# Patient Record
Sex: Female | Born: 1965
Health system: Southern US, Community
[De-identification: ages and names within clinical notes are randomized; demographics above are authoritative.]

---

## 2015-08-21 ENCOUNTER — Emergency Department (HOSPITAL_COMMUNITY)
Admission: EM | Admit: 2015-08-21 | Discharge: 2015-08-21 | Disposition: A | Discharge status: Home / Self Care | Payer: Medicaid Other | Attending: Emergency Medicine | Admitting: Emergency Medicine

## 2015-08-21 ENCOUNTER — Emergency Department (HOSPITAL_COMMUNITY): Payer: Medicaid Other

## 2015-08-21 ENCOUNTER — Encounter (HOSPITAL_COMMUNITY): Payer: Self-pay | Admitting: *Deleted

## 2015-08-21 DIAGNOSIS — R109 Unspecified abdominal pain: Secondary | ICD-10-CM | POA: Insufficient documentation

## 2015-08-21 DIAGNOSIS — R509 Fever, unspecified: Secondary | ICD-10-CM | POA: Insufficient documentation

## 2015-08-21 DIAGNOSIS — M2550 Pain in unspecified joint: Secondary | ICD-10-CM | POA: Diagnosis present

## 2015-08-21 DIAGNOSIS — R05 Cough: Secondary | ICD-10-CM | POA: Diagnosis not present

## 2015-08-21 DIAGNOSIS — R630 Anorexia: Secondary | ICD-10-CM | POA: Insufficient documentation

## 2015-08-21 DIAGNOSIS — R531 Weakness: Secondary | ICD-10-CM | POA: Diagnosis not present

## 2015-08-21 DIAGNOSIS — R51 Headache: Secondary | ICD-10-CM | POA: Diagnosis not present

## 2015-08-21 DIAGNOSIS — R059 Cough, unspecified: Secondary | ICD-10-CM

## 2015-08-21 LAB — CBC WITH DIFFERENTIAL/PLATELET
BASOS ABS: 0 10*3/uL (ref 0.0–0.1)
Basophils Relative: 1 % (ref 0–1)
EOS PCT: 3 % (ref 0–5)
Eosinophils Absolute: 0.2 10*3/uL (ref 0.0–0.7)
HCT: 42.3 % (ref 36.0–46.0)
Hemoglobin: 13.7 g/dL (ref 12.0–15.0)
LYMPHS ABS: 1.2 10*3/uL (ref 0.7–4.0)
LYMPHS PCT: 17 % (ref 12–46)
MCH: 30 pg (ref 26.0–34.0)
MCHC: 32.4 g/dL (ref 30.0–36.0)
MCV: 92.6 fL (ref 78.0–100.0)
MONO ABS: 0.7 10*3/uL (ref 0.1–1.0)
Monocytes Relative: 10 % (ref 3–12)
Neutro Abs: 5.1 10*3/uL (ref 1.7–7.7)
Neutrophils Relative %: 69 % (ref 43–77)
PLATELETS: 224 10*3/uL (ref 150–400)
RBC: 4.57 MIL/uL (ref 3.87–5.11)
RDW: 13.1 % (ref 11.5–15.5)
WBC: 7.3 10*3/uL (ref 4.0–10.5)

## 2015-08-21 LAB — COMPREHENSIVE METABOLIC PANEL
ALT: 20 U/L (ref 14–54)
ANION GAP: 7 (ref 5–15)
AST: 27 U/L (ref 15–41)
Albumin: 3.5 g/dL (ref 3.5–5.0)
Alkaline Phosphatase: 75 U/L (ref 38–126)
BUN: 10 mg/dL (ref 6–20)
CHLORIDE: 104 mmol/L (ref 101–111)
CO2: 27 mmol/L (ref 22–32)
Calcium: 9.1 mg/dL (ref 8.9–10.3)
Creatinine, Ser: 0.68 mg/dL (ref 0.44–1.00)
Glucose, Bld: 106 mg/dL — ABNORMAL HIGH (ref 65–99)
POTASSIUM: 4 mmol/L (ref 3.5–5.1)
Sodium: 138 mmol/L (ref 135–145)
Total Bilirubin: 0.6 mg/dL (ref 0.3–1.2)
Total Protein: 7.5 g/dL (ref 6.5–8.1)

## 2015-08-21 LAB — URINALYSIS, ROUTINE W REFLEX MICROSCOPIC
Bilirubin Urine: NEGATIVE
Glucose, UA: NEGATIVE mg/dL
Hgb urine dipstick: NEGATIVE
Ketones, ur: NEGATIVE mg/dL
LEUKOCYTES UA: NEGATIVE
Nitrite: NEGATIVE
PROTEIN: NEGATIVE mg/dL
Specific Gravity, Urine: 1.017 (ref 1.005–1.030)
UROBILINOGEN UA: 0.2 mg/dL (ref 0.0–1.0)
pH: 7.5 (ref 5.0–8.0)

## 2015-08-21 LAB — I-STAT CG4 LACTIC ACID, ED: LACTIC ACID, VENOUS: 1.27 mmol/L (ref 0.5–2.0)

## 2015-08-21 MED ORDER — MORPHINE SULFATE (PF) 2 MG/ML IV SOLN
2.0000 mg | Freq: Once | INTRAVENOUS | Status: AC
  Administered 2015-08-21: 2 mg via INTRAVENOUS
  Filled 2015-08-21: qty 1

## 2015-08-21 MED ORDER — METOCLOPRAMIDE HCL 5 MG/ML IJ SOLN
5.0000 mg | Freq: Once | INTRAMUSCULAR | Status: AC
  Administered 2015-08-21: 5 mg via INTRAVENOUS
  Filled 2015-08-21: qty 2

## 2015-08-21 MED ORDER — DIPHENHYDRAMINE HCL 50 MG/ML IJ SOLN
25.0000 mg | Freq: Once | INTRAMUSCULAR | Status: AC
  Administered 2015-08-21: 25 mg via INTRAVENOUS
  Filled 2015-08-21: qty 1

## 2015-08-21 MED ORDER — SODIUM CHLORIDE 0.9 % IV SOLN
INTRAVENOUS | Status: DC
  Administered 2015-08-21: 10:00:00 via INTRAVENOUS

## 2015-08-21 MED ORDER — TRAMADOL HCL 50 MG PO TABS: 50.0000 mg | ORAL_TABLET | Freq: Four times a day (QID) | ORAL | Status: DC | PRN

## 2015-08-21 NOTE — ED Notes (Signed)
Pt now reporting abdominal pain.

## 2015-08-21 NOTE — ED Notes (Addendum)
Pt c/o headache, abdominal pain, joint pain and fevers x 1 week.  Denies emesis or diarrhea, but c/o decreased appetite.  Clinical cytogeneticist used.

## 2015-08-21 NOTE — ED Notes (Signed)
MD at bedside with interpreter phone to give results.

## 2015-08-21 NOTE — Discharge Instructions (Signed)

## 2015-08-21 NOTE — ED Provider Notes (Signed)
CSN: [id number]     Arrival date & time 08/21/15  8295 History   First MD Initiated Contact with Patient 08/21/15 0935     Chief Complaint  Patient presents with  . Headache  . Joint Pain  . Abdominal Pain     (Consider location/radiation/quality/duration/timing/severity/associated sxs/prior Treatment) HPI Comments: Patient here complaining of one week of mild headache without photophobia, abdominal pain joint pain and subjective fevers. No vomiting or diarrhea but decreased appetite noted. Denies any rashes. No change in color to her urine. Denies any neck discomfort. History of malaria 10 months ago and has been discussion for the past 5 months. Denies any rashes. No treatment use prior to arrival nothing makes her symptoms better or worse  Patient is a 49 y.o. female presenting with headaches and abdominal pain. The history is provided by the patient. A language interpreter was used.  Headache Associated symptoms: abdominal pain   Abdominal Pain   History reviewed. No pertinent past medical history. Past Surgical History  Procedure Laterality Date  . Cesarean section     No family history on file. Social History  Substance Use Topics  . Smoking status: Never Smoker   . Smokeless tobacco: None  . Alcohol Use: No   OB History    No data available     Review of Systems  Gastrointestinal: Positive for abdominal pain.  Neurological: Positive for headaches.  All other systems reviewed and are negative.     Allergies  Review of patient's allergies indicates no known allergies.  Home Medications   Prior to Admission medications   Not on File   BP 120/73 mmHg  Pulse 80  Temp(Src) 98.5 F (36.9 C) (Oral)  Resp 17  SpO2 98%  LMP 07/20/2014 Physical Exam  Constitutional: She is oriented to person, place, and time. She appears well-developed and well-nourished.  Non-toxic appearance. No distress.  HENT:  Head: Normocephalic and atraumatic.  Eyes: Conjunctivae, EOM  and lids are normal. Pupils are equal, round, and reactive to light.  Neck: Normal range of motion. Neck supple. No tracheal deviation present. No thyroid mass present.  Cardiovascular: Normal rate, regular rhythm and normal heart sounds.  Exam reveals no gallop.   No murmur heard. Pulmonary/Chest: Effort normal and breath sounds normal. No stridor. No respiratory distress. She has no decreased breath sounds. She has no wheezes. She has no rhonchi. She has no rales.  Abdominal: Soft. Normal appearance and bowel sounds are normal. She exhibits no distension. There is no tenderness. There is no rebound and no CVA tenderness.  Musculoskeletal: Normal range of motion. She exhibits no edema or tenderness.  Neurological: She is alert and oriented to person, place, and time. She has normal strength. No cranial nerve deficit or sensory deficit. GCS eye subscore is 4. GCS verbal subscore is 5. GCS motor subscore is 6.  Skin: Skin is warm and dry. No abrasion and no rash noted.  Psychiatric: She has a normal mood and affect. Her speech is normal and behavior is normal.  Nursing note and vitals reviewed.   ED Course  Procedures (including critical care time) Labs Review Labs Reviewed  URINE CULTURE  CULTURE, BLOOD (ROUTINE X 2)  CULTURE, BLOOD (ROUTINE X 2)  MALARIA SMEAR  CBC WITH DIFFERENTIAL/PLATELET  COMPREHENSIVE METABOLIC PANEL  URINALYSIS, ROUTINE W REFLEX MICROSCOPIC (NOT AT [facility])  I-STAT CG4 LACTIC ACID, ED    Imaging Review No results found. I have personally reviewed and evaluated these images and lab results as  part of my medical decision-making.   EKG Interpretation None      MDM   Final diagnoses:  Cough    Patient's workup here shows no evidence of her symptoms. She relates that she has had abdominal discomfort and mild headache for one month in duration. Was given medications here feels better. No signs of acute neurological condition at this time. We given [facility]    Lorre Nick, MD 08/21/15 [phone]

## 2015-08-21 NOTE — ED Notes (Signed)
Per lab- preliminary result for malaria screen is negative.

## 2015-08-22 LAB — MALARIA SMEAR

## 2015-08-22 LAB — URINE CULTURE: CULTURE: NO GROWTH

## 2015-08-26 LAB — CULTURE, BLOOD (ROUTINE X 2)
Culture: NO GROWTH
Culture: NO GROWTH

## 2015-09-11 ENCOUNTER — Other Ambulatory Visit (HOSPITAL_COMMUNITY)
Admission: RE | Admit: 2015-09-11 | Discharge: 2015-09-11 | Disposition: A | Discharge status: Home / Self Care | Payer: Medicaid Other | Source: Ambulatory Visit | Attending: Obstetrics & Gynecology | Admitting: Obstetrics & Gynecology

## 2015-09-11 ENCOUNTER — Ambulatory Visit (INDEPENDENT_AMBULATORY_CARE_PROVIDER_SITE_OTHER): Payer: Medicaid Other | Admitting: Obstetrics & Gynecology

## 2015-09-11 ENCOUNTER — Encounter: Payer: Self-pay | Admitting: Obstetrics & Gynecology

## 2015-09-11 VITALS — BP 108/67 | HR 79 | Temp 98.0°F | Wt 128.0 lb

## 2015-09-11 DIAGNOSIS — Z01411 Encounter for gynecological examination (general) (routine) with abnormal findings: Secondary | ICD-10-CM | POA: Diagnosis present

## 2015-09-11 DIAGNOSIS — Z01419 Encounter for gynecological examination (general) (routine) without abnormal findings: Secondary | ICD-10-CM

## 2015-09-11 DIAGNOSIS — R87810 Cervical high risk human papillomavirus (HPV) DNA test positive: Secondary | ICD-10-CM | POA: Insufficient documentation

## 2015-09-11 DIAGNOSIS — Z1151 Encounter for screening for human papillomavirus (HPV): Secondary | ICD-10-CM

## 2015-09-11 DIAGNOSIS — Z0000 Encounter for general adult medical examination without abnormal findings: Secondary | ICD-10-CM

## 2015-09-11 DIAGNOSIS — Z124 Encounter for screening for malignant neoplasm of cervix: Secondary | ICD-10-CM | POA: Diagnosis not present

## 2015-09-11 DIAGNOSIS — Z1231 Encounter for screening mammogram for malignant neoplasm of breast: Secondary | ICD-10-CM | POA: Diagnosis not present

## 2015-09-11 NOTE — Progress Notes (Signed)
GYNECOLOGY CLINIC ANNUAL PREVENTATIVE CARE ENCOUNTER NOTE  Subjective:   Olivia Rocha is a 49 y.o. [id number] female here for a routine annual gynecologic exam.  She had [place] come into the country from [place] and appointments were made for her to come here and [facility] to establish care. [place] phone interpreter used Engineer, agricultural) used. Current complaints: no periods for a year.   Denies abnormal vaginal bleeding, discharge, pelvic pain, problems with intercourse or other gynecologic concerns.    Gynecologic History Patient's last menstrual period was 07/20/2014. Contraception: none and probably menopausal Last Pap: cannot recall if she ever had one. Never had a mammogram.  Obstetric History OB History  Gravida Para Term Preterm AB SAB TAB Ectopic Multiple Living  # Outcome Date GA Lbr Len/2nd Weight Sex Delivery Anes PTL Lv  13 SAB           12 Term           11 Term           10 Term           9 Term           8 Term           7 Term           6 Term           5 Term           4 Term           3 Term           2 Term           1 Term               No past medical history on file.  Past Surgical History  Procedure Laterality Date  . Cesarean section      Current Outpatient Prescriptions on File Prior to Visit  Medication Sig Dispense Refill  . traMADol (ULTRAM) 50 MG tablet Take 1 tablet (50 mg total) by mouth every 6 (six) hours as needed. (Patient not taking: Reported on 09/11/2015) 15 tablet 0   No current facility-administered medications on file prior to visit.    No Known Allergies  Social History   Social History  . Marital Status: Married    Spouse Name: N/A  . Number of Children: N/A  . Years of Education: N/A   Occupational History  . Not on file.   Social History Main Topics  . Smoking status: Never Smoker   . Smokeless tobacco: Not on file  . Alcohol Use: No  . Drug Use: No  .  Sexual Activity: Yes    Birth Control/ Protection: None   Other Topics Concern  . Not on file   Social History Narrative    No family history on file.  The following portions of the patient's history were reviewed and updated as appropriate: allergies, current medications, past family history, past medical history, past social history, past surgical history and problem list.  Review of Systems Pertinent items are noted in HPI.   Objective:  BP 108/67 mmHg  Pulse 79  Temp(Src) 98 F (36.7 C)  Wt 128 lb (58.06 kg)  LMP 07/20/2014 CONSTITUTIONAL: Well-developed, well-nourished female in no acute distress.  HENT:  Normocephalic, atraumatic, External right and left ear normal. Oropharynx is clear and moist EYES: Conjunctivae and EOM are  normal. Pupils are equal, round, and reactive to light. No scleral icterus.  NECK: Normal range of motion, supple, no masses.  Normal thyroid.  SKIN: Skin is warm and dry. No rash noted. Not diaphoretic. No erythema. No pallor. NEUROLGIC: Alert and oriented to person, place, and time. Normal reflexes, muscle tone coordination. No cranial nerve deficit noted. PSYCHIATRIC: Normal mood and affect. Normal behavior. Normal judgment and thought content. CARDIOVASCULAR: Normal heart rate noted, regular rhythm RESPIRATORY: Clear to auscultation bilaterally. Effort and breath sounds normal, no problems with respiration noted. BREASTS: Symmetric in size. No masses, skin changes, nipple drainage, or lymphadenopathy. ABDOMEN: Soft, normal bowel sounds, no distention noted.  No tenderness, rebound or guarding.   Badly healed vertical scar noted from patients cesarean section.  PELVIC: Patient has never had a pelvic exam outside pregnancy, was a difficult exam and had to use Pederson speculum despite adequate introitus.  Normal appearing external genitalia with Grade 1-2 anterior and posterior prolapse.  Unable to dermacate cervix from surrounding vagina, atrophy  noted.  Pap smear obtained, endocervical brush was able to delineate endocervical canal.  Unable to palpate uterus or adnexa due to voluntary guarding.    MUSCULOSKELETAL: Normal range of motion. No tenderness.  No cyanosis, clubbing, or edema.  2+ distal pulses.   Assessment:  Annual gynecologic examination with pap smear Amenorrhea x 1 year   Plan:  Will follow up results of pap smear and manage accordingly. Mammogram scheduled Patient was told she was most likely menopausal; was told to call for any concerns Routine preventative health maintenance measures emphasized. Please refer to After Visit Summary for other counseling recommendations.    Jaynie Collins, MD, FACOG Attending Obstetrician & Gynecologist, [facility]

## 2015-09-11 NOTE — Patient Instructions (Signed)
Preventive Care for Adults A healthy lifestyle and preventive care can promote health and wellness. Preventive health guidelines for women include the following key practices.  A routine yearly physical is a good way to check with your health care provider about your health and preventive screening. It is a chance to share any concerns and updates on your health and to receive a thorough exam.  Visit your dentist for a routine exam and preventive care every 6 months. Brush your teeth twice a day and floss once a day. Good oral hygiene prevents tooth decay and gum disease.  The frequency of eye exams is based on your age, health, family medical history, use of contact lenses, and other factors. Follow your health care provider's recommendations for frequency of eye exams.  Eat a healthy diet. Foods like vegetables, fruits, whole grains, low-fat dairy products, and lean protein foods contain the nutrients you need without too many calories. Decrease your intake of foods high in solid fats, added sugars, and salt. Eat the right amount of calories for you.Get information about a proper diet from your health care provider, if necessary.  Regular physical exercise is one of the most important things you can do for your health. Most adults should get at least 150 minutes of moderate-intensity exercise (any activity that increases your heart rate and causes you to sweat) each week. In addition, most adults need muscle-strengthening exercises on 2 or more days a week.  Maintain a healthy weight. The body mass index (BMI) is a screening tool to identify possible weight problems. It provides an estimate of body fat based on height and weight. Your health care provider can find your BMI and can help you achieve or maintain a healthy weight.For adults 20 years and older:  A BMI below 18.5 is considered underweight.  A BMI of 18.5 to 24.9 is normal.  A BMI of 25 to 29.9 is considered overweight.  A BMI of  30 and above is considered obese.  Maintain normal blood lipids and cholesterol levels by exercising and minimizing your intake of saturated fat. Eat a balanced diet with plenty of fruit and vegetables. Blood tests for lipids and cholesterol should begin at age 76 and be repeated every 5 years. If your lipid or cholesterol levels are high, you are over 50, or you are at high risk for heart disease, you may need your cholesterol levels checked more frequently.Ongoing high lipid and cholesterol levels should be treated with medicines if diet and exercise are not working.  If you smoke, find out from your health care provider how to quit. If you do not use tobacco, do not start.  Lung cancer screening is recommended for adults aged 22-80 years who are at high risk for developing lung cancer because of a history of smoking. A yearly low-dose CT scan of the lungs is recommended for people who have at least a 30-pack-year history of smoking and are a current smoker or have quit within the past 15 years. A pack year of smoking is smoking an average of 1 pack of cigarettes a day for 1 year (for example: 1 pack a day for 30 years or 2 packs a day for 15 years). Yearly screening should continue until the smoker has stopped smoking for at least 15 years. Yearly screening should be stopped for people who develop a health problem that would prevent them from having lung cancer treatment.  If you are pregnant, do not drink alcohol. If you are breastfeeding,  be very cautious about drinking alcohol. If you are not pregnant and choose to drink alcohol, do not have more than 1 drink per day. One drink is considered to be 12 ounces (355 mL) of beer, 5 ounces (148 mL) of wine, or 1.5 ounces (44 mL) of liquor.  Avoid use of street drugs. Do not share needles with anyone. Ask for help if you need support or instructions about stopping the use of drugs.  High blood pressure causes heart disease and increases the risk of  stroke. Your blood pressure should be checked at least every 1 to 2 years. Ongoing high blood pressure should be treated with medicines if weight loss and exercise do not work.  If you are 75-52 years old, ask your health care provider if you should take aspirin to prevent strokes.  Diabetes screening involves taking a blood sample to check your fasting blood sugar level. This should be done once every 3 years, after age 15, if you are within normal weight and without risk factors for diabetes. Testing should be considered at a younger age or be carried out more frequently if you are overweight and have at least 1 risk factor for diabetes.  Breast cancer screening is essential preventive care for women. You should practice "breast self-awareness." This means understanding the normal appearance and feel of your breasts and may include breast self-examination. Any changes detected, no matter how small, should be reported to a health care provider. Women in their 58s and 30s should have a clinical breast exam (CBE) by a health care provider as part of a regular health exam every 1 to 3 years. After age 16, women should have a CBE every year. Starting at age 53, women should consider having a mammogram (breast X-ray test) every year. Women who have a family history of breast cancer should talk to their health care provider about genetic screening. Women at a high risk of breast cancer should talk to their health care providers about having an MRI and a mammogram every year.  Breast cancer gene (BRCA)-related cancer risk assessment is recommended for women who have family members with BRCA-related cancers. BRCA-related cancers include breast, ovarian, tubal, and peritoneal cancers. Having family members with these cancers may be associated with an increased risk for harmful changes (mutations) in the breast cancer genes BRCA1 and BRCA2. Results of the assessment will determine the need for genetic counseling and  BRCA1 and BRCA2 testing.  Routine pelvic exams to screen for cancer are no longer recommended for nonpregnant women who are considered low risk for cancer of the pelvic organs (ovaries, uterus, and vagina) and who do not have symptoms. Ask your health care provider if a screening pelvic exam is right for you.  If you have had past treatment for cervical cancer or a condition that could lead to cancer, you need Pap tests and screening for cancer for at least 20 years after your treatment. If Pap tests have been discontinued, your risk factors (such as having a new sexual partner) need to be reassessed to determine if screening should be resumed. Some women have medical problems that increase the chance of getting cervical cancer. In these cases, your health care provider may recommend more frequent screening and Pap tests.  The HPV test is an additional test that may be used for cervical cancer screening. The HPV test looks for the virus that can cause the cell changes on the cervix. The cells collected during the Pap test can be  tested for HPV. The HPV test could be used to screen women aged 30 years and older, and should be used in women of any age who have unclear Pap test results. After the age of 30, women should have HPV testing at the same frequency as a Pap test.  Colorectal cancer can be detected and often prevented. Most routine colorectal cancer screening begins at the age of 50 years and continues through age 75 years. However, your health care provider may recommend screening at an earlier age if you have risk factors for colon cancer. On a yearly basis, your health care provider may provide home test kits to check for hidden blood in the stool. Use of a small camera at the end of a tube, to directly examine the colon (sigmoidoscopy or colonoscopy), can detect the earliest forms of colorectal cancer. Talk to your health care provider about this at age 50, when routine screening begins. Direct  exam of the colon should be repeated every 5-10 years through age 75 years, unless early forms of pre-cancerous polyps or small growths are found.  People who are at an increased risk for hepatitis B should be screened for this virus. You are considered at high risk for hepatitis B if:  You were born in a country where hepatitis B occurs often. Talk with your health care provider about which countries are considered high risk.  Your parents were born in a high-risk country and you have not received a shot to protect against hepatitis B (hepatitis B vaccine).  You have HIV or AIDS.  You use needles to inject street drugs.  You live with, or have sex with, someone who has hepatitis B.  You get hemodialysis treatment.  You take certain medicines for conditions like cancer, organ transplantation, and autoimmune conditions.  Hepatitis C blood testing is recommended for all people born from 1945 through 1965 and any individual with known risks for hepatitis C.  Practice safe sex. Use condoms and avoid high-risk sexual practices to reduce the spread of sexually transmitted infections (STIs). STIs include gonorrhea, chlamydia, syphilis, trichomonas, herpes, HPV, and human immunodeficiency virus (HIV). Herpes, HIV, and HPV are viral illnesses that have no cure. They can result in disability, cancer, and death.  You should be screened for sexually transmitted illnesses (STIs) including gonorrhea and chlamydia if:  You are sexually active and are younger than 24 years.  You are older than 24 years and your health care provider tells you that you are at risk for this type of infection.  Your sexual activity has changed since you were last screened and you are at an increased risk for chlamydia or gonorrhea. Ask your health care provider if you are at risk.  If you are at risk of being infected with HIV, it is recommended that you take a prescription medicine daily to prevent HIV infection. This is  called preexposure prophylaxis (PrEP). You are considered at risk if:  You are a heterosexual woman, are sexually active, and are at increased risk for HIV infection.  You take drugs by injection.  You are sexually active with a partner who has HIV.  Talk with your health care provider about whether you are at high risk of being infected with HIV. If you choose to begin PrEP, you should first be tested for HIV. You should then be tested every 3 months for as long as you are taking PrEP.  Osteoporosis is a disease in which the bones lose minerals and strength   with aging. This can result in serious bone fractures or breaks. The risk of osteoporosis can be identified using a bone density scan. Women ages 65 years and over and women at risk for fractures or osteoporosis should discuss screening with their health care providers. Ask your health care provider whether you should take a calcium supplement or vitamin D to reduce the rate of osteoporosis.  Menopause can be associated with physical symptoms and risks. Hormone replacement therapy is available to decrease symptoms and risks. You should talk to your health care provider about whether hormone replacement therapy is right for you.  Use sunscreen. Apply sunscreen liberally and repeatedly throughout the day. You should seek shade when your shadow is shorter than you. Protect yourself by wearing long sleeves, pants, a wide-brimmed hat, and sunglasses year round, whenever you are outdoors.  Once a month, do a whole body skin exam, using a mirror to look at the skin on your back. Tell your health care provider of new moles, moles that have irregular borders, moles that are larger than a pencil eraser, or moles that have changed in shape or color.  Stay current with required vaccines (immunizations).  Influenza vaccine. All adults should be immunized every year.  Tetanus, diphtheria, and acellular pertussis (Td, Tdap) vaccine. Pregnant women should  receive 1 dose of Tdap vaccine during each pregnancy. The dose should be obtained regardless of the length of time since the last dose. Immunization is preferred during the 27th-36th week of gestation. An adult who has not previously received Tdap or who does not know her vaccine status should receive 1 dose of Tdap. This initial dose should be followed by tetanus and diphtheria toxoids (Td) booster doses every 10 years. Adults with an unknown or incomplete history of completing a 3-dose immunization series with Td-containing vaccines should begin or complete a primary immunization series including a Tdap dose. Adults should receive a Td booster every 10 years.  Varicella vaccine. An adult without evidence of immunity to varicella should receive 2 doses or a second dose if she has previously received 1 dose. Pregnant females who do not have evidence of immunity should receive the first dose after pregnancy. This first dose should be obtained before leaving the health care facility. The second dose should be obtained 4-8 weeks after the first dose.  Human papillomavirus (HPV) vaccine. Females aged 13-26 years who have not received the vaccine previously should obtain the 3-dose series. The vaccine is not recommended for use in pregnant females. However, pregnancy testing is not needed before receiving a dose. If a female is found to be pregnant after receiving a dose, no treatment is needed. In that case, the remaining doses should be delayed until after the pregnancy. Immunization is recommended for any person with an immunocompromised condition through the age of 26 years if she did not get any or all doses earlier. During the 3-dose series, the second dose should be obtained 4-8 weeks after the first dose. The third dose should be obtained 24 weeks after the first dose and 16 weeks after the second dose.  Zoster vaccine. One dose is recommended for adults aged 60 years or older unless certain conditions are  present.  Measles, mumps, and rubella (MMR) vaccine. Adults born before 1957 generally are considered immune to measles and mumps. Adults born in 1957 or later should have 1 or more doses of MMR vaccine unless there is a contraindication to the vaccine or there is laboratory evidence of immunity to   each of the three diseases. A routine second dose of MMR vaccine should be obtained at least 28 days after the first dose for students attending postsecondary schools, health care workers, or international travelers. People who received inactivated measles vaccine or an unknown type of measles vaccine during 1963-1967 should receive 2 doses of MMR vaccine. People who received inactivated mumps vaccine or an unknown type of mumps vaccine before 1979 and are at high risk for mumps infection should consider immunization with 2 doses of MMR vaccine. For females of childbearing age, rubella immunity should be determined. If there is no evidence of immunity, females who are not pregnant should be vaccinated. If there is no evidence of immunity, females who are pregnant should delay immunization until after pregnancy. Unvaccinated health care workers born before 1957 who lack laboratory evidence of measles, mumps, or rubella immunity or laboratory confirmation of disease should consider measles and mumps immunization with 2 doses of MMR vaccine or rubella immunization with 1 dose of MMR vaccine.  Pneumococcal 13-valent conjugate (PCV13) vaccine. When indicated, a person who is uncertain of her immunization history and has no record of immunization should receive the PCV13 vaccine. An adult aged 19 years or older who has certain medical conditions and has not been previously immunized should receive 1 dose of PCV13 vaccine. This PCV13 should be followed with a dose of pneumococcal polysaccharide (PPSV23) vaccine. The PPSV23 vaccine dose should be obtained at least 8 weeks after the dose of PCV13 vaccine. An adult aged 19  years or older who has certain medical conditions and previously received 1 or more doses of PPSV23 vaccine should receive 1 dose of PCV13. The PCV13 vaccine dose should be obtained 1 or more years after the last PPSV23 vaccine dose.  Pneumococcal polysaccharide (PPSV23) vaccine. When PCV13 is also indicated, PCV13 should be obtained first. All adults aged 65 years and older should be immunized. An adult younger than age 65 years who has certain medical conditions should be immunized. Any person who resides in a nursing home or long-term care facility should be immunized. An adult smoker should be immunized. People with an immunocompromised condition and certain other conditions should receive both PCV13 and PPSV23 vaccines. People with human immunodeficiency virus (HIV) infection should be immunized as soon as possible after diagnosis. Immunization during chemotherapy or radiation therapy should be avoided. Routine use of PPSV23 vaccine is not recommended for American Indians, [place] Natives, or people younger than 65 years unless there are medical conditions that require PPSV23 vaccine. When indicated, people who have unknown immunization and have no record of immunization should receive PPSV23 vaccine. One-time revaccination 5 years after the first dose of PPSV23 is recommended for people aged 19-64 years who have chronic kidney failure, nephrotic syndrome, asplenia, or immunocompromised conditions. People who received 1-2 doses of PPSV23 before age 65 years should receive another dose of PPSV23 vaccine at age 65 years or later if at least 5 years have passed since the previous dose. Doses of PPSV23 are not needed for people immunized with PPSV23 at or after age 65 years.  Meningococcal vaccine. Adults with asplenia or persistent complement component deficiencies should receive 2 doses of quadrivalent meningococcal conjugate (MenACWY-D) vaccine. The doses should be obtained at least 2 months apart.  Microbiologists working with certain meningococcal bacteria, military recruits, people at risk during an outbreak, and people who travel to or live in countries with a high rate of meningitis should be immunized. A first-year college student up through age   21 years who is living in a residence hall should receive a dose if she did not receive a dose on or after her 16th birthday. Adults who have certain high-risk conditions should receive one or more doses of vaccine.  Hepatitis A vaccine. Adults who wish to be protected from this disease, have certain high-risk conditions, work with hepatitis A-infected animals, work in hepatitis A research labs, or travel to or work in countries with a high rate of hepatitis A should be immunized. Adults who were previously unvaccinated and who anticipate close contact with an international adoptee during the first 60 days after arrival in the [place] from a country with a high rate of hepatitis A should be immunized.  Hepatitis B vaccine. Adults who wish to be protected from this disease, have certain high-risk conditions, may be exposed to blood or other infectious body fluids, are household contacts or sex partners of hepatitis B positive people, are clients or workers in certain care facilities, or travel to or work in countries with a high rate of hepatitis B should be immunized.  Haemophilus influenzae type b (Hib) vaccine. A previously unvaccinated person with asplenia or sickle cell disease or having a scheduled splenectomy should receive 1 dose of Hib vaccine. Regardless of previous immunization, a recipient of a hematopoietic stem cell transplant should receive a 3-dose series 6-12 months after her successful transplant. Hib vaccine is not recommended for adults with HIV infection. Preventive Services / Frequency Ages 64 to 68 years  Blood pressure check.** / Every 1 to 2 years.  Lipid and cholesterol check.** / Every 5 years beginning at age  22.  Clinical breast exam.** / Every 3 years for women in their 88s and 53s.  BRCA-related cancer risk assessment.** / For women who have family members with a BRCA-related cancer (breast, ovarian, tubal, or peritoneal cancers).  Pap test.** / Every 2 years from ages 90 through 51. Every 3 years starting at age 21 through age 56 or 3 with a history of 3 consecutive normal Pap tests.  HPV screening.** / Every 3 years from ages 24 through ages 1 to 46 with a history of 3 consecutive normal Pap tests.  Hepatitis C blood test.** / For any individual with known risks for hepatitis C.  Skin self-exam. / Monthly.  Influenza vaccine. / Every year.  Tetanus, diphtheria, and acellular pertussis (Tdap, Td) vaccine.** / Consult your health care provider. Pregnant women should receive 1 dose of Tdap vaccine during each pregnancy. 1 dose of Td every 10 years.  Varicella vaccine.** / Consult your health care provider. Pregnant females who do not have evidence of immunity should receive the first dose after pregnancy.  HPV vaccine. / 3 doses over 6 months, if 72 and younger. The vaccine is not recommended for use in pregnant females. However, pregnancy testing is not needed before receiving a dose.  Measles, mumps, rubella (MMR) vaccine.** / You need at least 1 dose of MMR if you were born in 1957 or later. You may also need a 2nd dose. For females of childbearing age, rubella immunity should be determined. If there is no evidence of immunity, females who are not pregnant should be vaccinated. If there is no evidence of immunity, females who are pregnant should delay immunization until after pregnancy.  Pneumococcal 13-valent conjugate (PCV13) vaccine.** / Consult your health care provider.  Pneumococcal polysaccharide (PPSV23) vaccine.** / 1 to 2 doses if you smoke cigarettes or if you have certain conditions.  Meningococcal vaccine.** /  1 dose if you are age 19 to 21 years and a first-year college  student living in a residence hall, or have one of several medical conditions, you need to get vaccinated against meningococcal disease. You may also need additional booster doses.  Hepatitis A vaccine.** / Consult your health care provider.  Hepatitis B vaccine.** / Consult your health care provider.  Haemophilus influenzae type b (Hib) vaccine.** / Consult your health care provider. Ages 40 to 64 years  Blood pressure check.** / Every 1 to 2 years.  Lipid and cholesterol check.** / Every 5 years beginning at age 20 years.  Lung cancer screening. / Every year if you are aged 55-80 years and have a 30-pack-year history of smoking and currently smoke or have quit within the past 15 years. Yearly screening is stopped once you have quit smoking for at least 15 years or develop a health problem that would prevent you from having lung cancer treatment.  Clinical breast exam.** / Every year after age 40 years.  BRCA-related cancer risk assessment.** / For women who have family members with a BRCA-related cancer (breast, ovarian, tubal, or peritoneal cancers).  Mammogram.** / Every year beginning at age 40 years and continuing for as long as you are in good health. Consult with your health care provider.  Pap test.** / Every 3 years starting at age 30 years through age 65 or 70 years with a history of 3 consecutive normal Pap tests.  HPV screening.** / Every 3 years from ages 30 years through ages 65 to 70 years with a history of 3 consecutive normal Pap tests.  Fecal occult blood test (FOBT) of stool. / Every year beginning at age 50 years and continuing until age 75 years. You may not need to do this test if you get a colonoscopy every 10 years.  Flexible sigmoidoscopy or colonoscopy.** / Every 5 years for a flexible sigmoidoscopy or every 10 years for a colonoscopy beginning at age 50 years and continuing until age 75 years.  Hepatitis C blood test.** / For all people born from 1945 through  1965 and any individual with known risks for hepatitis C.  Skin self-exam. / Monthly.  Influenza vaccine. / Every year.  Tetanus, diphtheria, and acellular pertussis (Tdap/Td) vaccine.** / Consult your health care provider. Pregnant women should receive 1 dose of Tdap vaccine during each pregnancy. 1 dose of Td every 10 years.  Varicella vaccine.** / Consult your health care provider. Pregnant females who do not have evidence of immunity should receive the first dose after pregnancy.  Zoster vaccine.** / 1 dose for adults aged 60 years or older.  Measles, mumps, rubella (MMR) vaccine.** / You need at least 1 dose of MMR if you were born in 1957 or later. You may also need a 2nd dose. For females of childbearing age, rubella immunity should be determined. If there is no evidence of immunity, females who are not pregnant should be vaccinated. If there is no evidence of immunity, females who are pregnant should delay immunization until after pregnancy.  Pneumococcal 13-valent conjugate (PCV13) vaccine.** / Consult your health care provider.  Pneumococcal polysaccharide (PPSV23) vaccine.** / 1 to 2 doses if you smoke cigarettes or if you have certain conditions.  Meningococcal vaccine.** / Consult your health care provider.  Hepatitis A vaccine.** / Consult your health care provider.  Hepatitis B vaccine.** / Consult your health care provider.  Haemophilus influenzae type b (Hib) vaccine.** / Consult your health care provider. Ages 65   years and over  Blood pressure check.** / Every 1 to 2 years.  Lipid and cholesterol check.** / Every 5 years beginning at age 22 years.  Lung cancer screening. / Every year if you are aged 73-80 years and have a 30-pack-year history of smoking and currently smoke or have quit within the past 15 years. Yearly screening is stopped once you have quit smoking for at least 15 years or develop a health problem that would prevent you from having lung cancer  treatment.  Clinical breast exam.** / Every year after age 4 years.  BRCA-related cancer risk assessment.** / For women who have family members with a BRCA-related cancer (breast, ovarian, tubal, or peritoneal cancers).  Mammogram.** / Every year beginning at age 40 years and continuing for as long as you are in good health. Consult with your health care provider.  Pap test.** / Every 3 years starting at age 9 years through age 34 or 91 years with 3 consecutive normal Pap tests. Testing can be stopped between 65 and 70 years with 3 consecutive normal Pap tests and no abnormal Pap or HPV tests in the past 10 years.  HPV screening.** / Every 3 years from ages 57 years through ages 64 or 45 years with a history of 3 consecutive normal Pap tests. Testing can be stopped between 65 and 70 years with 3 consecutive normal Pap tests and no abnormal Pap or HPV tests in the past 10 years.  Fecal occult blood test (FOBT) of stool. / Every year beginning at age 15 years and continuing until age 17 years. You may not need to do this test if you get a colonoscopy every 10 years.  Flexible sigmoidoscopy or colonoscopy.** / Every 5 years for a flexible sigmoidoscopy or every 10 years for a colonoscopy beginning at age 86 years and continuing until age 71 years.  Hepatitis C blood test.** / For all people born from [birth year] through 1965 and any individual with known risks for hepatitis C.  Osteoporosis screening.** / A one-time screening for women ages 83 years and over and women at risk for fractures or osteoporosis.  Skin self-exam. / Monthly.  Influenza vaccine. / Every year.  Tetanus, diphtheria, and acellular pertussis (Tdap/Td) vaccine.** / 1 dose of Td every 10 years.  Varicella vaccine.** / Consult your health care provider.  Zoster vaccine.** / 1 dose for adults aged 61 years or older.  Pneumococcal 13-valent conjugate (PCV13) vaccine.** / Consult your health care provider.  Pneumococcal  polysaccharide (PPSV23) vaccine.** / 1 dose for all adults aged 28 years and older.  Meningococcal vaccine.** / Consult your health care provider.  Hepatitis A vaccine.** / Consult your health care provider.  Hepatitis B vaccine.** / Consult your health care provider.  Haemophilus influenzae type b (Hib) vaccine.** / Consult your health care provider. ** Family history and personal history of risk and conditions may change your health care provider's recommendations. Document Released: 02/01/2002 Document Revised: 04/22/2014 Document Reviewed: 05/03/2011 [facility] Patient Information 2015 [place], [place]. This information is not intended to replace advice given to you by your health care provider. Make sure you discuss any questions you have with your health care provider.

## 2015-09-16 ENCOUNTER — Ambulatory Visit (HOSPITAL_COMMUNITY): Payer: Medicaid Other | Attending: Obstetrics & Gynecology

## 2015-09-16 LAB — CYTOLOGY - PAP

## 2015-09-18 ENCOUNTER — Encounter: Payer: Self-pay | Admitting: Obstetrics & Gynecology

## 2015-09-19 ENCOUNTER — Encounter: Payer: Self-pay | Admitting: Family Medicine

## 2015-09-19 ENCOUNTER — Ambulatory Visit: Payer: Medicaid Other | Attending: Family Medicine | Admitting: Family Medicine

## 2015-09-19 VITALS — BP 107/73 | HR 83 | Temp 98.4°F | Resp 16 | Wt 140.0 lb

## 2015-09-19 DIAGNOSIS — M79604 Pain in right leg: Secondary | ICD-10-CM | POA: Insufficient documentation

## 2015-09-19 DIAGNOSIS — R7303 Prediabetes: Secondary | ICD-10-CM | POA: Insufficient documentation

## 2015-09-19 DIAGNOSIS — R739 Hyperglycemia, unspecified: Secondary | ICD-10-CM

## 2015-09-19 DIAGNOSIS — R7309 Other abnormal glucose: Secondary | ICD-10-CM | POA: Insufficient documentation

## 2015-09-19 DIAGNOSIS — Z114 Encounter for screening for human immunodeficiency virus [HIV]: Secondary | ICD-10-CM | POA: Diagnosis not present

## 2015-09-19 DIAGNOSIS — R946 Abnormal results of thyroid function studies: Secondary | ICD-10-CM | POA: Diagnosis not present

## 2015-09-19 DIAGNOSIS — M79605 Pain in left leg: Secondary | ICD-10-CM | POA: Diagnosis not present

## 2015-09-19 DIAGNOSIS — R7989 Other specified abnormal findings of blood chemistry: Secondary | ICD-10-CM

## 2015-09-19 LAB — TSH: TSH: 0.187 u[IU]/mL — AB (ref 0.350–4.500)

## 2015-09-19 LAB — POCT GLYCOSYLATED HEMOGLOBIN (HGB A1C): HEMOGLOBIN A1C: 6.1

## 2015-09-19 LAB — LIPID PANEL
Cholesterol: 179 mg/dL (ref 125–200)
HDL: 62 mg/dL (ref >=46)
LDL CALC: 104 mg/dL (ref <130)
Total CHOL/HDL Ratio: 2.9 Ratio (ref <=5.0)
Triglycerides: 66 mg/dL (ref <150)
VLDL: 13 mg/dL (ref <30)

## 2015-09-19 MED ORDER — IBUPROFEN 600 MG PO TABS: 600.0000 mg | ORAL_TABLET | Freq: Three times a day (TID) | ORAL | Status: DC | PRN

## 2015-09-19 NOTE — Progress Notes (Signed)
[employer] [phone] Establish Care  C/C leg problem  Pain scale #10

## 2015-09-19 NOTE — Progress Notes (Signed)
   Subjective:  Patient ID: Geanie Logan, female    DOB: [birth year]  Age: 49 y.o. MRN: [id number]  CC: Establish Care and Leg Pain  HPI Kristelle Nyiramavugu presents for   1. Leg pain: x 1 year in L leg. No injury. Patient is from [place]. She has had treatment but does not recall what. No weakness in leg. Pain is entire L leg and L hip. She recently also developed R leg pain. She was evaluated in ED at the beginning of the month. Normal labs. Normal CXR.  No past medical history on file.  Past Surgical History  Procedure Laterality Date  . Cesarean section      No family history on file.  Social History  Substance Use Topics  . Smoking status: Never Smoker   . Smokeless tobacco: Not on file  . Alcohol Use: No    ROS Review of Systems  Constitutional: Negative for fever and chills.  Eyes: Negative for visual disturbance.  Respiratory: Negative for shortness of breath.   Cardiovascular: Negative for chest pain.  Gastrointestinal: Negative for blood in stool.  Musculoskeletal: Positive for myalgias and arthralgias. Negative for back pain.  Skin: Negative for rash.  Allergic/Immunologic: Negative for immunocompromised state.  Hematological: Negative for adenopathy. Does not bruise/bleed easily.  Psychiatric/Behavioral: Negative for suicidal ideas and dysphoric mood.    Objective:   Today's Vitals: BP 107/73 mmHg  Pulse 83  Temp(Src) 98.4 F (36.9 C) (Oral)  Resp 16  Wt 140 lb (63.504 kg)  SpO2 96%  LMP 07/20/2014  Physical Exam  Constitutional: She is oriented to person, place, and time. She appears well-developed and well-nourished. No distress.  HENT:  Head: Normocephalic and atraumatic.  Cardiovascular: Normal rate, regular rhythm, normal heart sounds and intact distal pulses.   Pulmonary/Chest: Effort normal and breath sounds normal.  Musculoskeletal: She exhibits no edema or tenderness.       Right hip: Normal.       Left hip: Normal.   Right knee: Normal.       Left knee: Normal.       Right ankle: Normal.  L ankle is slightly inverted Varicose veins on L dorsal foot   Neurological: She is alert and oriented to person, place, and time.  Skin: Skin is warm and dry. No rash noted.  Psychiatric: She has a normal mood and affect.    Assessment & Plan:   Leg pain, bilateral A; L leg pain x one year, cramping and aches. More recent onset of R leg pain. Exam normal except for L foot varicose veins and inversion of L foot. Normal CBC and CMP. P: Lipids Vit D NSAID Tonic water   Prediabetes Patient provided information on low carb eating      Outpatient Encounter Prescriptions as of 09/19/2015  Medication Sig  . ibuprofen (ADVIL,MOTRIN) 600 MG tablet Take 1 tablet (600 mg total) by mouth every 8 (eight) hours as needed.  . [DISCONTINUED] traMADol (ULTRAM) 50 MG tablet Take 1 tablet (50 mg total) by mouth every 6 (six) hours as needed. (Patient not taking: Reported on 09/19/2015)   No facility-administered encounter medications on file as of 09/19/2015.    Follow-up: No Follow-up on file.    Dessa Phi MD

## 2015-09-19 NOTE — Assessment & Plan Note (Signed)
Patient provided information on low carb eating

## 2015-09-19 NOTE — Assessment & Plan Note (Signed)
A; L leg pain x one year, cramping and aches. More recent onset of R leg pain. Exam normal except for L foot varicose veins and inversion of L foot. Normal CBC and CMP. P: Lipids Vit D NSAID Tonic water

## 2015-09-19 NOTE — Patient Instructions (Addendum)
Mrs. Olivia Rocha,   Olivia Rocha was seen today for establish care and leg pain.  Diagnoses and all orders for this visit:  Elevated blood sugar -     POCT glycosylated hemoglobin (Hb A1C)  Prediabetes  Leg pain, bilateral -     Lipid Panel -     Vitamin D, 25-hydroxy -     TSH -     ibuprofen (ADVIL,MOTRIN) 600 MG tablet; Take 1 tablet (600 mg total) by mouth every 8 (eight) hours as needed.  recommend ibuprofen and tonic water. Tonic water if over the counter.  F/u in 3-4 weeks for leg pain  Dr. Armen Pickup

## 2015-09-20 LAB — HIV ANTIBODY (ROUTINE TESTING W REFLEX): HIV: NONREACTIVE

## 2015-09-20 LAB — VITAMIN D 25 HYDROXY (VIT D DEFICIENCY, FRACTURES): VIT D 25 HYDROXY: 28 ng/mL — AB (ref 30–100)

## 2015-09-22 ENCOUNTER — Telehealth: Payer: Self-pay | Admitting: *Deleted

## 2015-09-22 DIAGNOSIS — R7989 Other specified abnormal findings of blood chemistry: Secondary | ICD-10-CM | POA: Insufficient documentation

## 2015-09-22 NOTE — Addendum Note (Signed)
Addended by: Dessa Phi on: 09/22/2015 02:17 PM   Modules accepted: Orders

## 2015-09-22 NOTE — Telephone Encounter (Signed)
See Result note on cytology 09/11/15.   [employer] [id number],Attempted patient, results of cytology given with recommendations by Dr. Macon Large.

## 2015-09-29 ENCOUNTER — Telehealth: Payer: Self-pay

## 2015-09-29 NOTE — Telephone Encounter (Signed)
-----   Message from Josalyn Funches, MD sent at 09/22/2015  2:16 PM EDT ----- Vit D insufficiency, take 2000 IU of D3 daily, OTC TSH is low, f/u free T3 and T4 is needed to evaluate for hyperthyroidism. Patient to return to office for blood draw. Labs ordered.  Screening HIV negative Cholesterol is normal   

## 2015-09-29 NOTE — Telephone Encounter (Signed)
Nurse called patient via [employer], Intepreter [phone], reached voice mail.  Interpreter left message for patient to return call to [facility] with [facility] at [phone]

## 2015-09-30 ENCOUNTER — Telehealth: Payer: Self-pay

## 2015-09-30 NOTE — Telephone Encounter (Signed)
See previous note

## 2015-10-05 DIAGNOSIS — Z7189 Other specified counseling: Secondary | ICD-10-CM

## 2015-10-05 DIAGNOSIS — Z7185 Encounter for immunization safety counseling: Secondary | ICD-10-CM

## 2015-10-05 NOTE — Congregational Nurse Program (Addendum)
10-01-15 Office visit to schedule appointment to obtain lab report for HIV and syphilis test received at [facility], on 04/16/15.                                                                          Blood pressure 111/70. Pulse 81. Appointment : 10-20-15 @ [facility] for laboratory results, MMR (measles, mumps, rubella) and Hepatitis vaccine.  [place] [phone]) [address] [place], [place] Transportation provided (2 bus tickets).    BP 111/70 mmHg  Pulse 81  LMP 07/20/2014

## 2015-10-14 ENCOUNTER — Telehealth: Payer: Self-pay

## 2015-10-14 ENCOUNTER — Ambulatory Visit: Payer: Medicaid Other | Admitting: Family Medicine

## 2015-10-14 NOTE — Telephone Encounter (Signed)
Nurse called patient via [employer], Interpreter [phone]. Reached voicemail, interpreter left message for patient to return call to [facility] at [phone]. Nurse will send letter.

## 2015-10-14 NOTE — Telephone Encounter (Signed)
-----   Message from Dessa PhiJosalyn Funches, MD sent at 09/22/2015  2:16 PM EDT ----- Vit D insufficiency, take 2000 IU of D3 daily, OTC TSH is low, f/u free T3 and T4 is needed to evaluate for hyperthyroidism. Patient to return to office for blood draw. Labs ordered.  Screening HIV negative Cholesterol is normal

## 2015-11-06 ENCOUNTER — Ambulatory Visit: Payer: Medicaid Other | Attending: Family Medicine | Admitting: Family Medicine

## 2015-11-06 ENCOUNTER — Encounter: Payer: Self-pay | Admitting: Family Medicine

## 2015-11-06 VITALS — BP 107/72 | HR 71 | Temp 98.1°F | Resp 16 | Ht <= 58 in | Wt 115.0 lb

## 2015-11-06 DIAGNOSIS — I83892 Varicose veins of left lower extremities with other complications: Secondary | ICD-10-CM | POA: Diagnosis present

## 2015-11-06 DIAGNOSIS — B353 Tinea pedis: Secondary | ICD-10-CM | POA: Insufficient documentation

## 2015-11-06 DIAGNOSIS — M79605 Pain in left leg: Secondary | ICD-10-CM

## 2015-11-06 DIAGNOSIS — M79604 Pain in right leg: Secondary | ICD-10-CM | POA: Insufficient documentation

## 2015-11-06 DIAGNOSIS — R946 Abnormal results of thyroid function studies: Secondary | ICD-10-CM

## 2015-11-06 DIAGNOSIS — R7989 Other specified abnormal findings of blood chemistry: Secondary | ICD-10-CM

## 2015-11-06 LAB — TSH: TSH: 0.273 u[IU]/mL — ABNORMAL LOW (ref 0.350–4.500)

## 2015-11-06 LAB — T3, FREE: T3 FREE: 3.3 pg/mL (ref 2.3–4.2)

## 2015-11-06 LAB — T4, FREE: FREE T4: 1.14 ng/dL (ref 0.80–1.80)

## 2015-11-06 MED ORDER — MEDICAL COMPRESSION STOCKINGS MISC: Status: DC

## 2015-11-06 MED ORDER — ACETAMINOPHEN-CODEINE #3 300-30 MG PO TABS
1.0000 | ORAL_TABLET | Freq: Three times a day (TID) | ORAL | Status: DC | PRN
Start: 2015-11-06 — End: 2018-05-16

## 2015-11-06 MED ORDER — KETOCONAZOLE 2 % EX CREA: 1.0000 "application " | TOPICAL_CREAM | Freq: Every day | CUTANEOUS | Status: DC

## 2015-11-06 NOTE — Assessment & Plan Note (Signed)
A: tinea pedis on L foot P: nizoral cream ordered

## 2015-11-06 NOTE — Assessment & Plan Note (Signed)
A: low TSH P: Repeat TSH T3 and T4

## 2015-11-06 NOTE — Progress Notes (Signed)
Subjective:  Patient ID: Olivia Rocha, female    DOB: 23-Oct-1966  Age: 49 y.o. MRN: [id number]  CC: Leg Pain  HPI Chynna Nyiramavugu presents for   1. Leg pain: x 1 year in L leg. No injury. Patient is from [place]. She has had treatment but does not recall what. No weakness in leg. Pain is entire L leg and L hip. She recently also developed R leg pain. Labs done at last OV normal except for low TSH. Pain is worsening. Ibuprofen has no helped.   2. Low TSH: TSH low at last OV. No vision changes, palpitations or tremors. She denies weight loss.   History reviewed. No pertinent past medical history.  Past Surgical History  Procedure Laterality Date  . Cesarean section      History reviewed. No pertinent family history.  Social History  Substance Use Topics  . Smoking status: Never Smoker   . Smokeless tobacco: Not on file  . Alcohol Use: No    ROS Review of Systems  Constitutional: Negative for fever and chills.  Eyes: Negative for visual disturbance.  Respiratory: Positive for shortness of breath.   Cardiovascular: Positive for leg swelling. Negative for chest pain and palpitations.  Gastrointestinal: Negative for blood in stool.  Musculoskeletal: Positive for myalgias and arthralgias. Negative for back pain.  Skin: Positive for rash.  Allergic/Immunologic: Negative for immunocompromised state.  Hematological: Negative for adenopathy. Does not bruise/bleed easily.  Psychiatric/Behavioral: Negative for suicidal ideas and dysphoric mood.    Objective:   Today's Vitals: BP 107/72 mmHg  Pulse 71  Temp(Src) 98.1 F (36.7 C) (Oral)  Resp 16  Wt 115 lb (52.164 kg)  SpO2 95%  LMP 07/20/2014  Wt Readings from Last 3 Encounters:  11/06/15 115 lb (52.164 kg)  09/19/15 140 lb (63.504 kg)  09/11/15 128 lb (58.06 kg)    Physical Exam  Constitutional: She is oriented to person, place, and time. She appears well-developed and well-nourished. No distress.    HENT:  Head: Normocephalic and atraumatic.  Cardiovascular: Normal rate, regular rhythm, normal heart sounds and intact distal pulses.   Pulmonary/Chest: Effort normal and breath sounds normal.  Musculoskeletal: She exhibits edema. She exhibits no tenderness.       Right hip: Normal.       Left hip: Normal.       Right knee: Normal.       Left knee: Normal.       Right ankle: Normal.  L ankle is slightly inverted Varicose veins on L dorsal foot   Neurological: She is alert and oriented to person, place, and time.  Skin: Skin is warm and dry. Rash noted.  Psychiatric: She has a normal mood and affect.   Assessment & Plan:   Mayre was seen today for leg pain.  Diagnoses and all orders for this visit:  Varicose veins of left leg with edema -     Cancel: Ambulatory referral to Interventional Radiology -     Ambulatory referral to Vascular Surgery -     Elastic Bandages & Supports (MEDICAL COMPRESSION STOCKINGS) MISC; 30 mmHg pressuer  Leg pain, bilateral -     Cancel: Ambulatory referral to Interventional Radiology -     Ambulatory referral to Vascular Surgery -     acetaminophen-codeine (TYLENOL #3) 300-30 MG tablet; Take 1 tablet by mouth every 8 (eight) hours as needed for moderate pain. -     Elastic Bandages & Supports (MEDICAL COMPRESSION STOCKINGS) MISC; 30 mmHg pressuer  Low TSH level -     TSH -     T3, Free -     T4, free  Tinea pedis of left foot -     ketoconazole (NIZORAL) 2 % cream; Apply 1 application topically daily. To L foot   Outpatient Encounter Prescriptions as of 11/06/2015  Medication Sig  . ibuprofen (ADVIL,MOTRIN) 600 MG tablet Take 1 tablet (600 mg total) by mouth every 8 (eight) hours as needed. (Patient not taking: Reported on 11/06/2015)   No facility-administered encounter medications on file as of 11/06/2015.    Follow-up: No Follow-up on file.    Dessa PhiJosalyn Shirah Roseman  MD

## 2015-11-06 NOTE — Assessment & Plan Note (Addendum)
A: varicose veins with pain in L leg P: Referral to vascular  Compression stockings  Tylenol #3 for pain control

## 2015-11-06 NOTE — Progress Notes (Signed)
[employer] [place] # [phone] F/U leg problem Pain scale# 10 No tobacco user  No suicide thought in the past two week

## 2015-11-06 NOTE — Patient Instructions (Addendum)
Diagnoses and all orders for this visit:  Varicose veins of left leg with edema -     Cancel: Ambulatory referral to Interventional Radiology -     Ambulatory referral to Vascular Surgery -     Elastic Bandages & Supports (MEDICAL COMPRESSION STOCKINGS) MISC; 30 mmHg pressuer  Leg pain, bilateral -     Cancel: Ambulatory referral to Interventional Radiology -     Ambulatory referral to Vascular Surgery -     acetaminophen-codeine (TYLENOL #3) 300-30 MG tablet; Take 1 tablet by mouth every 8 (eight) hours as needed for moderate pain. -     Elastic Bandages & Supports (MEDICAL COMPRESSION STOCKINGS) MISC; 30 mmHg pressuer  Low TSH level -     TSH  Tinea pedis of left foot -     ketoconazole (NIZORAL) 2 % cream; Apply 1 application topically daily. To L foot   TSH and free T3 and T4   F/u in 2 months for leg pain   Dr. Armen Pickup   Varicose Veins Varicose veins are veins that have become enlarged and twisted. They are usually seen in the legs but can occur in other parts of the body as well. CAUSES This condition is the result of valves in the veins not working properly. Valves in the veins help to return blood from the leg to the heart. If these valves are damaged, blood flows backward and backs up into the veins in the leg near the skin. This causes the veins to become larger. RISK FACTORS People who are on their feet a lot, who are pregnant, or who are overweight are more likely to develop varicose veins. SIGNS AND SYMPTOMS  Bulging, twisted-appearing, bluish veins, most commonly found on the legs.  Leg pain or a feeling of heaviness. These symptoms may be worse at the end of the day.  Leg swelling.  Changes in skin color. DIAGNOSIS A health care provider can usually diagnose varicose veins by examining your legs. Your health care provider may also recommend an ultrasound of your leg veins. TREATMENT Most varicose veins can be treated at home.However, other treatments are  available for people who have persistent symptoms or want to improve the cosmetic appearance of the varicose veins. These treatment options include:  Sclerotherapy. A solution is injected into the vein to close it off.  Laser treatment. A laser is used to heat the vein to close it off.  Radiofrequency vein ablation. An electrical current produced by radio waves is used to close off the vein.  Phlebectomy. The vein is surgically removed through small incisions made over the varicose vein.  Vein ligation and stripping. The vein is surgically removed through incisions made over the varicose vein after the vein has been tied (ligated). HOME CARE INSTRUCTIONS  Do not stand or sit in one position for long periods of time. Do not sit with your legs crossed. Rest with your legs raised during the day.  Wear compression stockings as directed by your health care provider. These stockings help to prevent blood clots and reduce swelling in your legs.  Do not wear other tight, encircling garments around your legs, pelvis, or waist.  Walk as much as possible to increase blood flow.  Raise the foot of your bed at night with 2-inch blocks.  If you get a cut in the skin over the vein and the vein bleeds, lie down with your leg raised and press on it with a clean cloth until the bleeding stops. Then place  a bandage (dressing) on the cut. See your health care provider if it continues to bleed. SEEK MEDICAL CARE IF:  The skin around your ankle starts to break down.  You have pain, redness, tenderness, or hard swelling in your leg over a vein.  You are uncomfortable because of leg pain.   This information is not intended to replace advice given to you by your health care provider. Make sure you discuss any questions you have with your health care provider.   Document Released: 09/15/2005 Document Revised: 12/27/2014 Document Reviewed: 04/23/2014 Elsevier Interactive Patient Education [employer]2016 Elsevier  Inc.

## 2015-11-07 ENCOUNTER — Other Ambulatory Visit: Payer: Self-pay | Admitting: Pharmacist

## 2015-11-07 MED ORDER — KETOCONAZOLE 2 % EX CREA: TOPICAL_CREAM | CUTANEOUS | Status: DC

## 2015-11-12 ENCOUNTER — Telehealth: Payer: Self-pay | Admitting: *Deleted

## 2015-11-12 NOTE — Telephone Encounter (Signed)
-----   Message from Dessa PhiJosalyn Funches, MD sent at 11/07/2015  9:39 AM EST ----- Subclinical hyperthyroidism with low TSH Normal free T3 and T4

## 2015-11-12 NOTE — Telephone Encounter (Signed)
LVM to return call.

## 2015-11-26 NOTE — Telephone Encounter (Signed)
-----   Message from Earnestine LeysKimberly L Bennett-Curse, [place] sent at 09/24/2015  5:11 PM EDT -----   ----- Message -----    From: Dessa PhiJosalyn Funches, MD    Sent: 09/22/2015   2:16 PM      To: Tandy GawHeather A Woods, RN, #  Vit D insufficiency, take 2000 IU of D3 daily, OTC TSH is low, f/u free T3 and T4 is needed to evaluate for hyperthyroidism. Patient to return to office for blood draw. Labs ordered.  Screening HIV negative Cholesterol is normal

## 2015-12-28 ENCOUNTER — Emergency Department (HOSPITAL_COMMUNITY)
Admission: EM | Admit: 2015-12-28 | Discharge: 2015-12-29 | Disposition: A | Discharge status: Home / Self Care | Payer: Medicaid Other | Attending: Emergency Medicine | Admitting: Emergency Medicine

## 2015-12-28 ENCOUNTER — Encounter (HOSPITAL_COMMUNITY): Payer: Self-pay

## 2015-12-28 ENCOUNTER — Emergency Department (HOSPITAL_COMMUNITY): Payer: Medicaid Other

## 2015-12-28 DIAGNOSIS — W19XXXA Unspecified fall, initial encounter: Secondary | ICD-10-CM

## 2015-12-28 DIAGNOSIS — Z79899 Other long term (current) drug therapy: Secondary | ICD-10-CM | POA: Insufficient documentation

## 2015-12-28 DIAGNOSIS — Y998 Other external cause status: Secondary | ICD-10-CM | POA: Insufficient documentation

## 2015-12-28 DIAGNOSIS — Y9389 Activity, other specified: Secondary | ICD-10-CM | POA: Insufficient documentation

## 2015-12-28 DIAGNOSIS — S93601A Unspecified sprain of right foot, initial encounter: Secondary | ICD-10-CM | POA: Insufficient documentation

## 2015-12-28 DIAGNOSIS — Y9289 Other specified places as the place of occurrence of the external cause: Secondary | ICD-10-CM | POA: Diagnosis not present

## 2015-12-28 DIAGNOSIS — M545 Low back pain: Secondary | ICD-10-CM

## 2015-12-28 DIAGNOSIS — W000XXA Fall on same level due to ice and snow, initial encounter: Secondary | ICD-10-CM | POA: Diagnosis not present

## 2015-12-28 DIAGNOSIS — S99921A Unspecified injury of right foot, initial encounter: Secondary | ICD-10-CM | POA: Diagnosis present

## 2015-12-28 DIAGNOSIS — S3992XA Unspecified injury of lower back, initial encounter: Secondary | ICD-10-CM | POA: Diagnosis not present

## 2015-12-28 MED ORDER — NAPROXEN 250 MG PO TABS
500.0000 mg | ORAL_TABLET | Freq: Once | ORAL | Status: AC
  Administered 2015-12-29: 500 mg via ORAL
  Filled 2015-12-28: qty 2

## 2015-12-28 NOTE — ED Provider Notes (Signed)
CSN: [id number]     Arrival date & time 12/28/15  2212 History   First MD Initiated Contact with Patient 12/28/15 2248     Chief Complaint  Patient presents with  . Leg Pain     (Consider location/radiation/quality/duration/timing/severity/associated sxs/prior Treatment) HPI Comments: 50 year old female with no significant past medical history presents to the emergency department for evaluation of pain which began secondary to a fall at 10 AM today. Patient fell on her right side on her way to church after slipping on ice. No reported head injury or trauma. Patient complaining of pain in her right foot which radiates up her right leg to her right low back. She also reports having pain in her right lower back. She has been resistant to putting weight on her right foot secondary to pain. No medications taken prior to arrival. No history of fracture.   Patient is a 50 y.o. female presenting with leg pain. The history is provided by the patient and a relative. A language interpreter was used (family at bedside).  Leg Pain Associated symptoms: back pain     History reviewed. No pertinent past medical history. Past Surgical History  Procedure Laterality Date  . Cesarean section     History reviewed. No pertinent family history. Social History  Substance Use Topics  . Smoking status: Never Smoker   . Smokeless tobacco: None  . Alcohol Use: No   OB History    Gravida Para Term Preterm AB TAB SAB Ectopic Multiple Living   13 12 12  1  1   4       Review of Systems  Musculoskeletal: Positive for back pain, joint swelling (mild; R foot) and arthralgias.  Neurological: Negative for syncope.  All other systems reviewed and are negative.   Allergies  Review of patient's allergies indicates no known allergies.  Home Medications   Prior to Admission medications   Medication Sig Start Date End Date Taking? Authorizing Provider  acetaminophen-codeine (TYLENOL #3) 300-30 MG tablet Take 1  tablet by mouth every 8 (eight) hours as needed for moderate pain. 11/06/15   Dessa Phi, MD  Elastic Bandages & Supports (MEDICAL COMPRESSION STOCKINGS) MISC 30 mmHg pressuer 11/06/15   Dessa Phi, MD  ibuprofen (ADVIL,MOTRIN) 600 MG tablet Take 1 tablet (600 mg total) by mouth every 6 (six) hours as needed. 12/29/15   Antony Madura, PA-C  ketoconazole (NIZORAL) 2 % cream APPLY 1 APPLICATION TOPICALLY DAILY TO LEFT FOOT 11/07/15   Josalyn Funches, MD   BP 106/75 mmHg  Pulse 78  Temp(Src) 98.4 F (36.9 C) (Oral)  Resp 20  SpO2 99%  LMP 07/20/2014   Physical Exam  Constitutional: She is oriented to person, place, and time. She appears well-developed and well-nourished. No distress.  Nontoxic/nonseptic appearing  HENT:  Head: Normocephalic and atraumatic.  Eyes: Conjunctivae and EOM are normal. No scleral icterus.  Neck: Normal range of motion.  Cardiovascular: Normal rate, regular rhythm and intact distal pulses.   DP and PT pulses 2+ in the right lower extremity.  Pulmonary/Chest: Effort normal. No respiratory distress.  Respirations even and unlabored  Musculoskeletal: Normal range of motion.       Right knee: She exhibits normal range of motion and no swelling.       Right ankle: Normal.       Cervical back: Normal.       Thoracic back: Normal.       Lumbar back: She exhibits tenderness and pain. She exhibits normal range  of motion, no swelling and no spasm.       Back:       Right foot: There is tenderness, bony tenderness and swelling. There is normal range of motion, normal capillary refill, no deformity and no laceration.       Feet:  No shortening or malrotation of the RLE. Mild swelling to the dorsum of the R foot. No bony deformity or crepitus.  Neurological: She is alert and oriented to person, place, and time. She exhibits normal muscle tone. Coordination normal.  Skin: Skin is warm and dry. No rash noted. She is not diaphoretic. No erythema. No pallor.    Psychiatric: She has a normal mood and affect. Her behavior is normal.  Nursing note and vitals reviewed.   ED Course  Procedures (including critical care time) Labs Review Labs Reviewed - No data to display  Imaging Review Dg Lumbar Spine Complete  12/29/2015  CLINICAL DATA:  Lumbosacral back pain after fall.  Foot pain. EXAM: LUMBAR SPINE - COMPLETE 4+ VIEW COMPARISON:  None. FINDINGS: The bones are under mineralized. There are 6 non-rib-bearing lumbar vertebra, the lower most non-rib-bearing lumbar vertebra will be designated L6. Vertebral body heights are maintained. No evidence of fracture. Alignment is maintained. No significant disc space narrowing. Sacroiliac joints are congruent. IMPRESSION: Bony under mineralization.  No evidence of acute fracture. Electronically Signed   By: Rubye OaksMelanie  Ehinger M.D.   On: 12/29/2015 00:12   Dg Foot Complete Right  12/29/2015  CLINICAL DATA:  50 year old female with fall and trauma to the right foot. EXAM: RIGHT FOOT COMPLETE - 3+ VIEW COMPARISON:  None. FINDINGS: No acute fracture or dislocation. There is diffuse soft tissue swelling of the foot. No radiopaque foreign object. IMPRESSION: No acute fracture. Electronically Signed   By: Elgie CollardArash  Radparvar M.D.   On: 12/29/2015 00:11     I have personally reviewed and evaluated these images and lab results as part of my medical decision-making.   EKG Interpretation None      MDM   Final diagnoses:  Fall, initial encounter  Sprain of foot, right, initial encounter  Right low back pain, with sciatica presence unspecified    50 year old female presents to the emergency department for evaluation of right low back pain and right foot pain after a slip and fall on ice at 10 AM today. No head trauma or loss of consciousness. Patient is neurovascularly intact. X-ray shows no evidence of acute fracture, dislocation, or bony deformity. Symptoms consistent with contusion of the low back as well as sprain of  right foot. Ace wrap applied to foot and supportive care instructions provided. Patient told to follow-up with her primary care doctor if her pain persists. Return precautions given at discharge. Patient discharged in good condition; VSS.   Filed Vitals:   12/28/15 2300 12/28/15 2315 12/29/15 0017 12/29/15 0018  BP: 101/71 108/83 106/75   Pulse: 84 74  78  Temp:      TempSrc:      Resp:      SpO2: 99% 99%  99%       Antony MaduraKelly Sissy Goetzke, PA-C 12/29/15 0025  Gwyneth SproutWhitney Plunkett, MD 01/01/16 1239

## 2015-12-28 NOTE — ED Notes (Signed)
Pt speaks  [place]ese

## 2015-12-28 NOTE — ED Notes (Signed)
To triage via EMS.  Pt does not speak english and EMS does not know what language pt speaks.  Family is on way.  Per EMS pt fell outside 10 hours ago.  C/o right lateral knee pain and buttock pain.  Can bend right knee, + pedal pulse.

## 2015-12-29 MED ORDER — IBUPROFEN 600 MG PO TABS: 600.0000 mg | ORAL_TABLET | Freq: Four times a day (QID) | ORAL | Status: DC | PRN

## 2015-12-29 NOTE — Discharge Instructions (Signed)
Yako eksirei leo ni hasi kwa mifupa yoyote kuvunjwa. Tunapendekeza kwamba kuchukua Tylenol au ibuprofen kwa maumivu. Unaweza wrap mguu wako na Ace wrap kwa msaada kama taka. Ice [employer] ya maumivu mara 3-4 kwa siku kwa muda wa dakika 15-20 kila wakati. Kufuatilia na daktari wake huduma za msingi kama maumivu yataendelea. Kurudi kwa idara ya dharura kama inahitajika kama dalili mbaya zaidi.  Your x-rays today are negative for any broken bones. We recommend that you take Tylenol or ibuprofen for pain. You may wrap your foot with an Ace wrap for support if desired. Ice areas of pain 3-4 times per day for 15-20 minutes each time. Follow-up with her primary care doctor if pain persists. Return to the emergency department as needed if symptoms worsen.  RICE for Routine Care of Injuries Theroutine careofmanyinjuriesincludes rest, ice, compression, and elevation (RICE therapy). RICE therapy is often recommended for injuries to soft tissues, such as a muscle strain, ligament injuries, bruises, and overuse injuries. It can also be used for some bony injuries. Using RICE therapy can help to relieve pain, lessen swelling, and enable your body to heal. Rest Rest is required to allow your body to heal. This usually involves reducing your normal activities and avoiding use of the injured part of your body. Generally, you can return to your normal activities when you are comfortable and have been given permission by your health care provider. Ice Icing your injury helps to keep the swelling down, and it lessens pain. Do not apply ice directly to your skin.  Put ice in a plastic bag.  Place a towel between your skin and the bag.  Leave the ice on for 20 minutes, 2-3 times a day. Do this for as long as you are directed by your health care provider. Compression Compression means putting pressure on the injured area. Compression helps to keep swelling down, gives support, and helps with discomfort. Compression may  be done with an elastic bandage. If an elastic bandage has been applied, follow these general tips:  Remove and reapply the bandage every 3-4 hours or as directed by your health care provider.  Make sure the bandage is not wrapped too tightly, because this can cut off circulation. If part of your body beyond the bandage becomes blue, numb, cold, swollen, or more painful, your bandage is most likely too tight. If this occurs, remove your bandage and reapply it more loosely.  See your health care provider if the bandage seems to be making your problems worse rather than better. Elevation Elevation means keeping the injured area raised. This helps to lessen swelling and decrease pain. If possible, your injured area should be elevated at or above the level of your heart or the center of your chest. WHEN SHOULD I SEEK MEDICAL CARE? You should seek medical care if:  Your pain and swelling continue.  Your symptoms are getting worse rather than improving. These symptoms may indicate that further evaluation or further X-rays are needed. Sometimes, X-rays may not show a small broken bone (fracture) until a number of days later. Make a follow-up appointment with your health care provider. WHEN SHOULD I SEEK IMMEDIATE MEDICAL CARE? You should seek immediate medical care if:  You have sudden severe pain at or below the area of your injury.  You have redness or increased swelling around your injury.  You have tingling or numbness at or below the area of your injury that does not improve after you remove the elastic bandage.   This information  is not intended to replace advice given to you by your health care provider. Make sure you discuss any questions you have with your health care provider.   Document Released: 03/20/2001 Document Revised: 08/27/2015 Document Reviewed: 11/13/2014 Elsevier Interactive Patient Education [employer]2016 Elsevier Inc.

## 2015-12-29 NOTE — ED Notes (Signed)
Pt stable, ambulatory, states understanding of discharge instructions 

## 2016-04-14 DIAGNOSIS — J3489 Other specified disorders of nose and nasal sinuses: Principal | ICD-10-CM

## 2016-04-14 NOTE — Congregational Nurse Program (Signed)
Congregational Nurse Program Note  Date of Encounter: 04/14/2016  Past Medical History: No past medical history on file.  Encounter Details:     CNP Questionnaire - 04/14/16 1427    Patient Demographics   Is this a new or existing patient? Existing   Patient is considered a/an Refugee   Race African   Patient Assistance   Location of Patient Assistance Not Applicable   Patient's financial/insurance status Low Income;Medicaid   Uninsured Patient No   Patient referred to apply for the following financial assistance Not Applicable   Food insecurities addressed Not Applicable   Transportation assistance No   Assistance securing medications No   Educational health offerings Medications   Encounter Details   Primary purpose of visit Acute Illness/Condition Visit   Was an Emergency Department visit averted? Not Applicable   Does patient have a medical provider? Yes   Patient referred to Not Applicable   Was a mental health screening completed? (GAINS tool) No   Does patient have dental issues? No   Does patient have vision issues? No   Since previous encounter, have you referred patient for abnormal blood pressure that resulted in a new diagnosis or medication change? No   Since previous encounter, have you referred patient for abnormal blood glucose that resulted in a new diagnosis or medication change? No     Office visit at [employer] [place] [facility] to request evaluation of nasal congestion , headache and itching throat and eyes several days and OTC med suggestion.  Kinyarwanda language interpreter used. Currently no meds taken. Throat pink without lesions. Chest clear. Headache of frontal area. Denies dizziness or vision blurring. Clear nasal drainage with redness of nasal mucosa. Normal vitals. Plan: Referred to [facility] pharmacist for advice on OTC antihistamines for allergy symptoms. Warm salt water gargles and tea. Return to office if  worse or call PCP for appointment. Ferol LuzMarietta Marijo Quizon, RN/Congregational Nurse.

## 2016-09-03 IMAGING — DX DG LUMBAR SPINE COMPLETE 4+V
5 series · 5 of 5 positions shown · non-contrast
Comparison: None.

CLINICAL DATA: Lumbosacral back pain after fall.  Foot pain.

EXAM:
LUMBAR SPINE - COMPLETE 4+ VIEW

[l-spine ap]
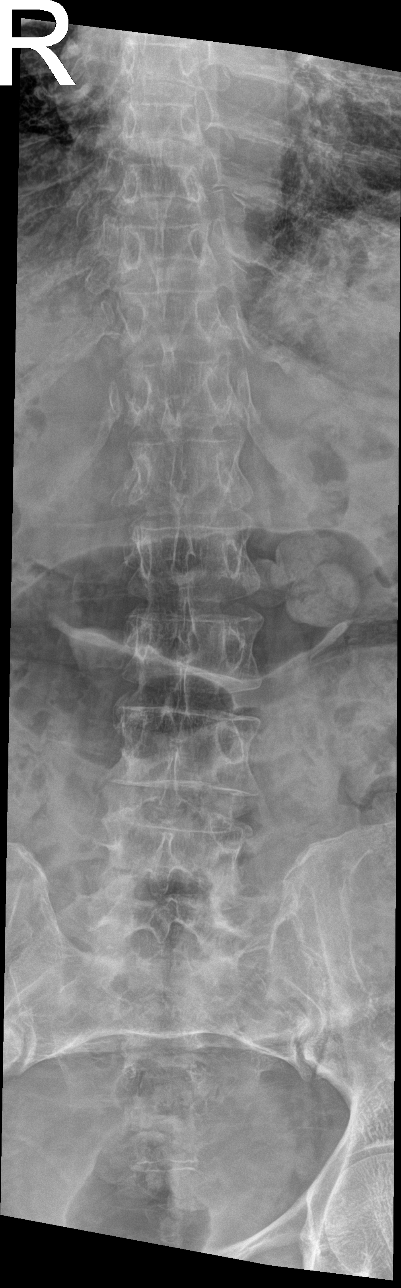

[l-spine obl (1 of 2)]
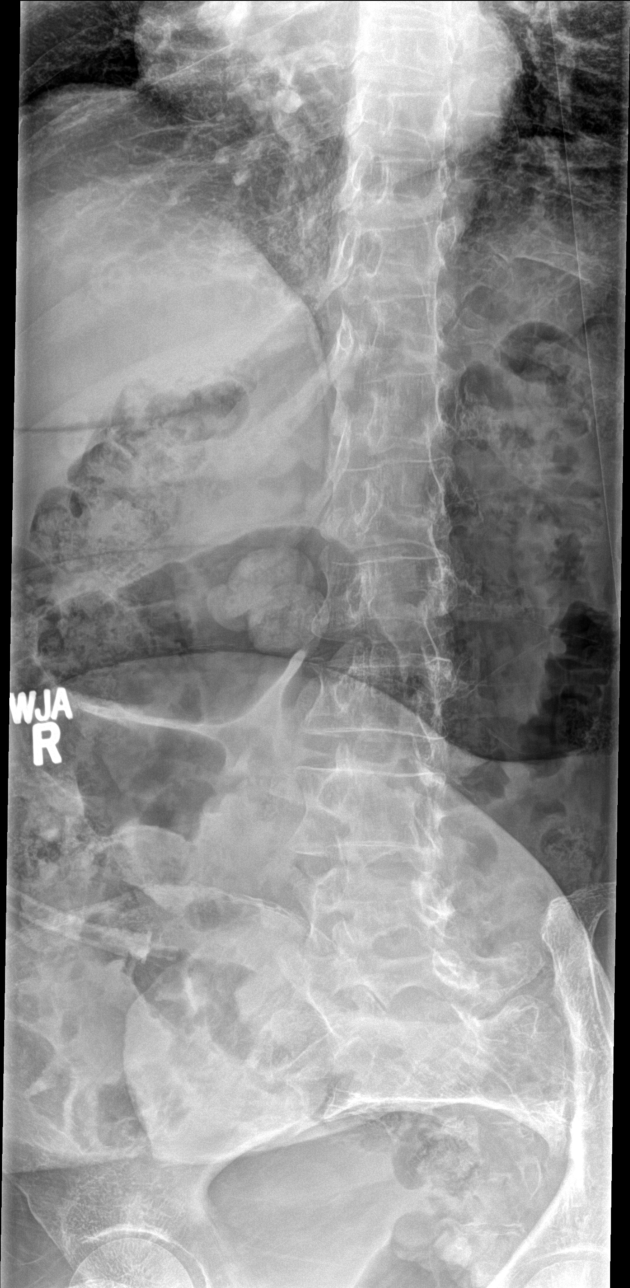

[l-spine obl (2 of 2)]
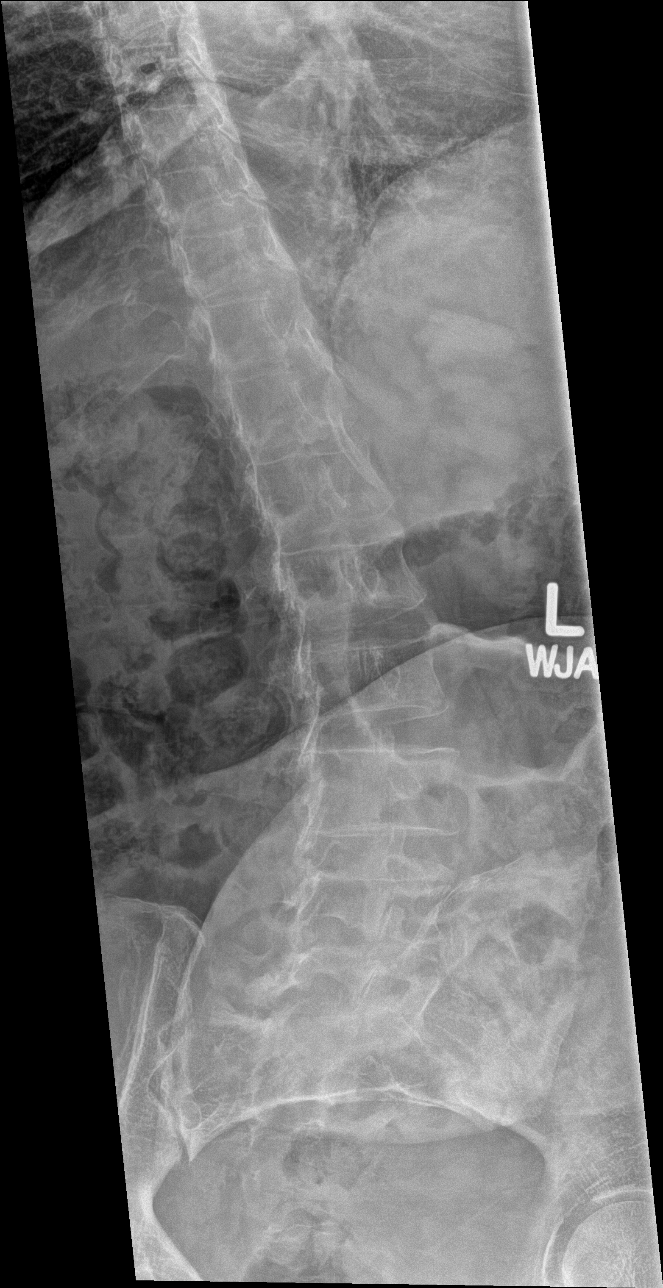

[l-spine lat]
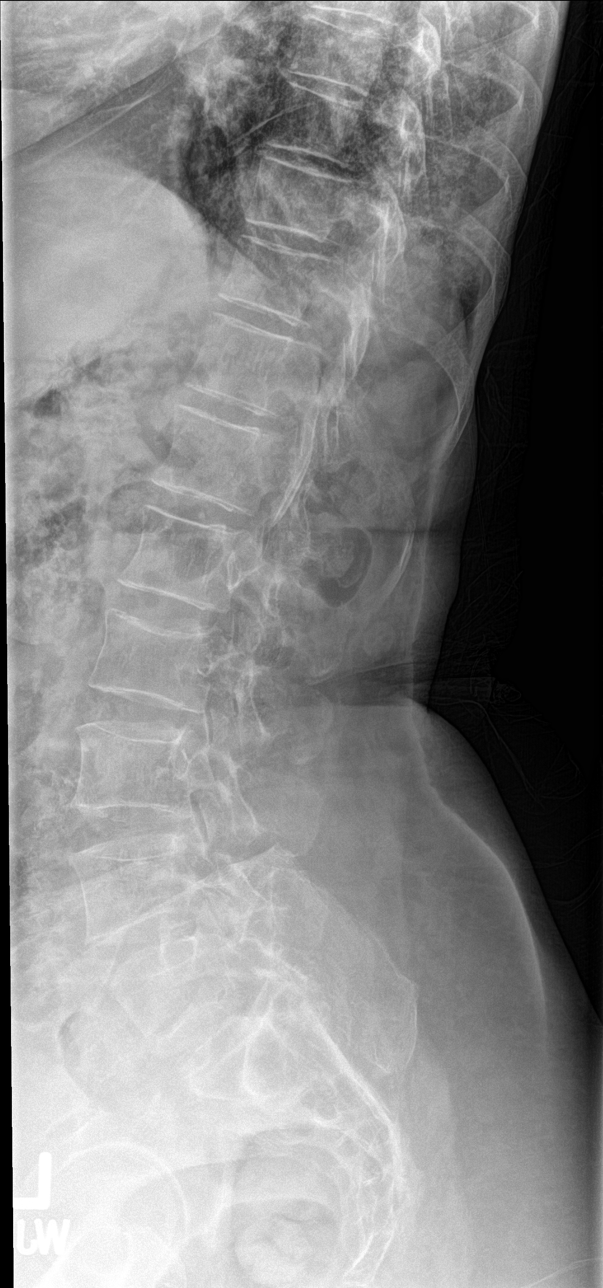

[l-spine spot]
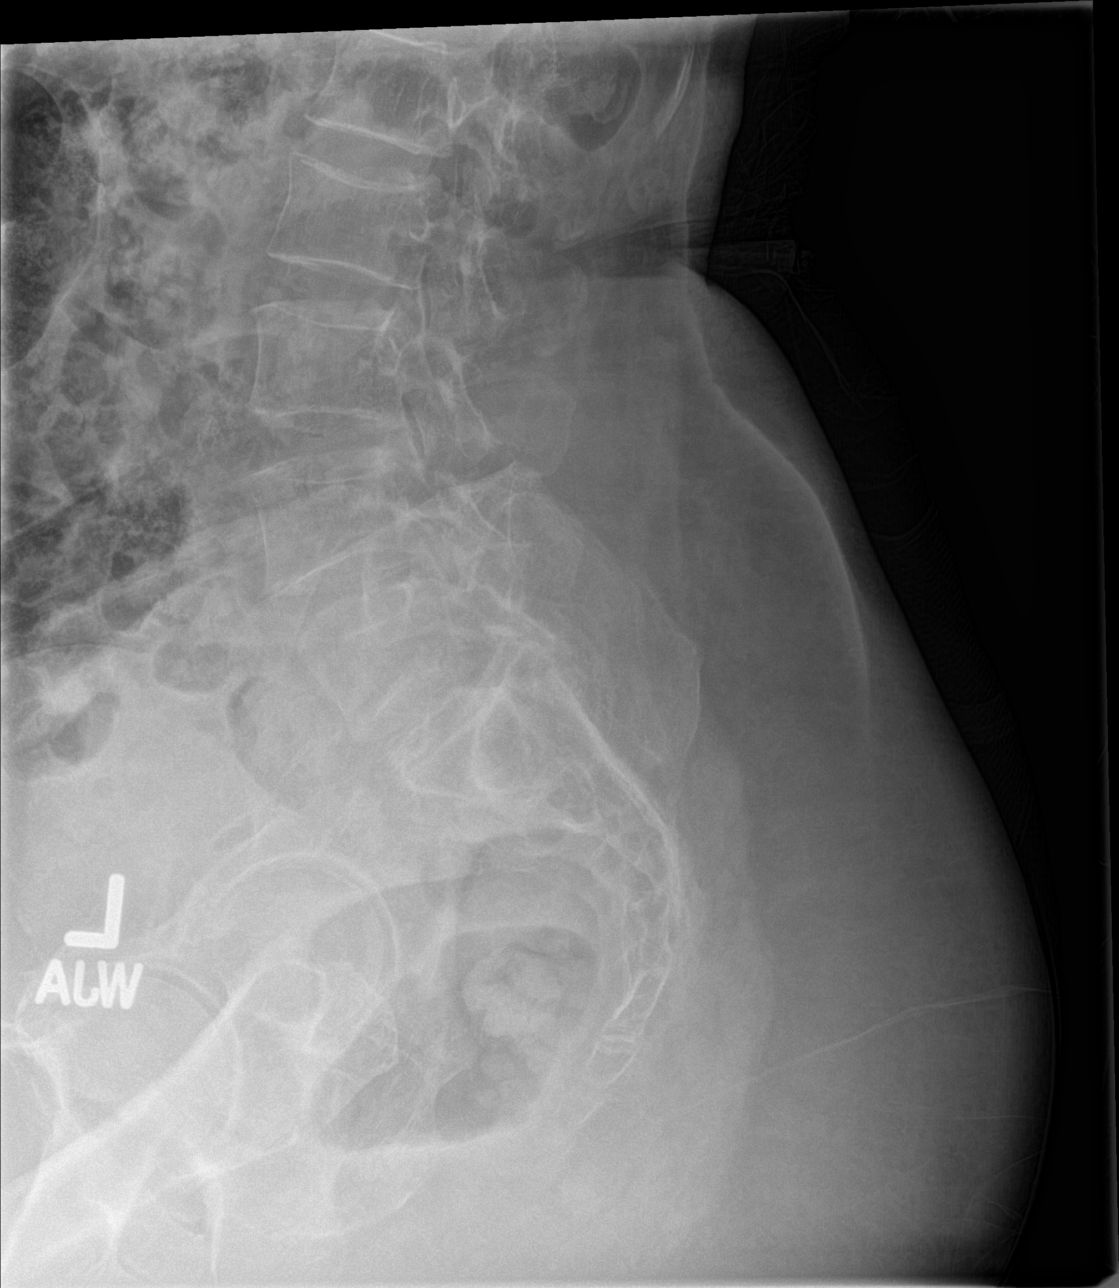

[5 of 5 positions shown; findings below may reference images not displayed]

FINDINGS: The bones are under mineralized. There are 6 non-rib-bearing lumbar
vertebra, the lower most non-rib-bearing lumbar vertebra will be
designated L6. Vertebral body heights are maintained. No evidence of
fracture. Alignment is maintained. No significant disc space
narrowing. Sacroiliac joints are congruent.
IMPRESSION: Bony under mineralization.  No evidence of acute fracture.

## 2018-04-14 ENCOUNTER — Ambulatory Visit (INDEPENDENT_AMBULATORY_CARE_PROVIDER_SITE_OTHER): Payer: Self-pay | Admitting: Physician Assistant

## 2018-05-16 ENCOUNTER — Encounter (INDEPENDENT_AMBULATORY_CARE_PROVIDER_SITE_OTHER): Payer: Self-pay | Admitting: Nurse Practitioner

## 2018-05-16 ENCOUNTER — Ambulatory Visit (INDEPENDENT_AMBULATORY_CARE_PROVIDER_SITE_OTHER): Payer: BLUE CROSS/BLUE SHIELD | Admitting: Nurse Practitioner

## 2018-05-16 ENCOUNTER — Other Ambulatory Visit: Payer: Self-pay

## 2018-05-16 VITALS — BP 121/83 | HR 72 | Temp 98.2°F | Ht <= 58 in | Wt 123.2 lb

## 2018-05-16 DIAGNOSIS — Z1231 Encounter for screening mammogram for malignant neoplasm of breast: Secondary | ICD-10-CM | POA: Diagnosis not present

## 2018-05-16 DIAGNOSIS — M25511 Pain in right shoulder: Secondary | ICD-10-CM | POA: Diagnosis not present

## 2018-05-16 DIAGNOSIS — M25512 Pain in left shoulder: Secondary | ICD-10-CM

## 2018-05-16 DIAGNOSIS — Z1211 Encounter for screening for malignant neoplasm of colon: Secondary | ICD-10-CM | POA: Diagnosis not present

## 2018-05-16 DIAGNOSIS — R7303 Prediabetes: Secondary | ICD-10-CM

## 2018-05-16 LAB — POCT GLYCOSYLATED HEMOGLOBIN (HGB A1C): Hemoglobin A1C: 6.1 % — AB (ref 4.0–5.6)

## 2018-05-16 MED ORDER — IBUPROFEN 600 MG PO TABS: 600.0000 mg | ORAL_TABLET | Freq: Four times a day (QID) | ORAL | 1 refills | Status: DC | PRN

## 2018-05-16 MED ORDER — TIZANIDINE HCL 4 MG PO TABS: 4.0000 mg | ORAL_TABLET | Freq: Four times a day (QID) | ORAL | 1 refills | Status: DC | PRN

## 2018-05-16 NOTE — Patient Instructions (Signed)
Shoulder Pain Many things can cause shoulder pain, including:  An injury.  Moving the arm in the same way again and again (overuse).  Joint pain (arthritis).  Follow these instructions at home: Take these actions to help with your pain:  Squeeze a soft ball or a foam pad as much as you can. This helps to prevent swelling. It also makes the arm stronger.  Take over-the-counter and prescription medicines only as told by your doctor.  If told, put ice on the area: ? Put ice in a plastic bag. ? Place a towel between your skin and the bag. ? Leave the ice on for 20 minutes, 2-3 times per day. Stop putting on ice if it does not help with the pain.  If you were given a shoulder sling or immobilizer: ? Wear it as told. ? Remove it to shower or bathe. ? Move your arm as little as possible. ? Keep your hand moving. This helps prevent swelling.  Contact a doctor if:  Your pain gets worse.  Medicine does not help your pain.  You have new pain in your arm, hand, or fingers. Get help right away if:  Your arm, hand, or fingers: ? Tingle. ? Are numb. ? Are swollen. ? Are painful. ? Turn white or blue. This information is not intended to replace advice given to you by your health care provider. Make sure you discuss any questions you have with your health care provider. Document Released: 05/24/2008 Document Revised: 08/01/2016 Document Reviewed: 03/31/2015 Elsevier Interactive Patient Education  2018 Elsevier Inc.  

## 2018-05-16 NOTE — Progress Notes (Signed)
Assessment & Plan:  Olivia Rocha was seen today for new patient (initial visit).  Diagnoses and all orders for this visit:  Acute pain of both shoulders -     tiZANidine (ZANAFLEX) 4 MG tablet; Take 1 tablet (4 mg total) by mouth every 6 (six) hours as needed for muscle spasms. -     ibuprofen (ADVIL,MOTRIN) 600 MG tablet; Take 1 tablet (600 mg total) by mouth every 6 (six) hours as needed. The also alternate with Tylenol as needed for pain relief. May try heat application to affected areas for pain relief.   Prediabetes -     HgB A1c -     CBC -     Basic metabolic panel -     Lipid panel -     Thyroid Panel With TSH  Breast cancer screening by mammogram -     MM 3D SCREEN BREAST BILATERAL; Future  Colon cancer screening -     Ambulatory referral to Gastroenterology    Patient has been counseled on age-appropriate routine health concerns for screening and prevention. These are reviewed and up-to-date. Referrals have been placed accordingly. Immunizations are up-to-date or declined.    Subjective:   Chief Complaint  Patient presents with  . New Patient (Initial Visit)   HPI Olivia Rocha 52 y.o. female presents to office today to establish care as a new patient. She has a history of prediabetes and has complaints of bilateral shoulder pain. VRI was used to communicate directly with patient for the entire encounter including providing detailed patient instructions.  She is accompanied by her son today as well.   Shoulder Pain Patient complaints of bilateral shoulder pain. 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  The onset of the pain was 5 months ago.  The pain occurs lasts 30 minutes to an hour.  Pain location is posterior neck and bilateral trapezius and scapula areas. No history of dislocation. Symptoms are aggravated by reaching, lifting, pulling, ADL's,  repetitive use, work at or above shoulder height. Symptoms are diminished by  rest.  No stiffness is reported. 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 She has not taken any OTC medications for pain.   Prediabetes A1c is stable.  We discussed dietary modifications today. Denies any hypo-or hyperglycemic symptoms.  Lab Results  Component Value Date   HGBA1C 6.1 (A) 05/16/2018    Review of Systems  Constitutional: Negative for fever, malaise/fatigue and weight loss.  HENT: Negative.  Negative for nosebleeds.   Eyes: Negative.  Negative for blurred vision, double vision and photophobia.  Respiratory: Negative.  Negative for cough and shortness of breath.   Cardiovascular: Negative.  Negative for chest pain, palpitations and leg swelling.  Gastrointestinal: Negative.  Negative for heartburn, nausea and vomiting.  Genitourinary: Negative.   Musculoskeletal: Positive for myalgias and neck pain. Negative for back pain, falls and joint pain.  Neurological: Negative.  Negative for dizziness, focal weakness, seizures and headaches.  Psychiatric/Behavioral: Negative.  Negative for suicidal ideas.    No past medical history on file.  Past Surgical History:  Procedure Laterality Date  . CESAREAN SECTION      No family history on file.  Social History Reviewed with no changes to be made today.   Outpatient Medications Prior to Visit  Medication Sig Dispense Refill  . Elastic Bandages & Supports (MEDICAL COMPRESSION  STOCKINGS) MISC 30 mmHg pressuer (Patient not taking: Reported on 05/16/2018) 2 each 0  . acetaminophen-codeine (TYLENOL #3) 300-30 MG tablet Take 1 tablet by mouth every 8 (eight) hours as needed for moderate pain. (Patient not taking: Reported on 05/16/2018) 30 tablet 0  . ibuprofen (ADVIL,MOTRIN) 600 MG tablet Take 1 tablet (600 mg total) by mouth every 6 (six) hours as needed. (Patient not taking: Reported on 05/16/2018) 30 tablet 0  . ketoconazole (NIZORAL) 2 %  cream APPLY 1 APPLICATION TOPICALLY DAILY TO LEFT FOOT (Patient not taking: Reported on 05/16/2018) 30 g 0   No facility-administered medications prior to visit.     No Known Allergies     Objective:    BP 121/83 (BP Location: Right Arm, Patient Position: Sitting, Cuff Size: Normal)   Pulse 72   Temp 98.2 F (36.8 C) (Oral)   Ht  (1.397 m)   Wt 123 lb 3.2 oz (55.9 kg)   LMP 07/20/2014   SpO2 96%   BMI 28.63 kg/m  Wt Readings from Last 3 Encounters:  05/16/18 123 lb 3.2 oz (55.9 kg)  11/06/15 115 lb (52.2 kg)  09/19/15 140 lb (63.5 kg)    Physical Exam  Constitutional: She is oriented to person, place, and time. She appears well-developed and well-nourished. She is cooperative.  HENT:  Head: Normocephalic and atraumatic.  Eyes: EOM are normal.  Neck: No JVD present. Muscular tenderness present. No spinous process tenderness present.    Cardiovascular: Normal rate, regular rhythm and normal heart sounds. Exam reveals no gallop and no friction rub.  No murmur heard. Pulmonary/Chest: Effort normal and breath sounds normal. No tachypnea. No respiratory distress. She has no decreased breath sounds. She has no wheezes. She has no rhonchi. She has no rales. She exhibits no tenderness.  Abdominal: Soft. Bowel sounds are normal.  Musculoskeletal: Normal range of motion. She exhibits tenderness. She exhibits no edema or deformity.       Right shoulder: She exhibits pain (elicited with passive adduction and abduction of arm. ).       Left shoulder: She exhibits pain (elicited with passive adduction and abduction of arm. ).  Neurological: She is alert and oriented to person, place, and time. Coordination normal.  Skin: Skin is warm and dry.  Psychiatric: She has a normal mood and affect. Her behavior is normal. Judgment and thought content normal.  Nursing note and vitals reviewed.        Patient has been counseled extensively about nutrition and exercise as well as the  importance of adherence with medications and regular follow-up. The patient was given clear instructions to go to ER or return to medical center if symptoms don't improve, worsen or new problems develop. The patient verbalized understanding.   Follow-up: Return in about 1 month (around 06/13/2018) for shoulder pain.   Claiborne Rigg, FNP-BC [facility] and [facility] [place], [place] [phone]   05/16/2018, 11:20 AM

## 2018-05-17 LAB — BASIC METABOLIC PANEL WITH GFR
BUN/Creatinine Ratio: 17 (ref 9–23)
BUN: 11 mg/dL (ref 6–24)
CO2: 24 mmol/L (ref 20–29)
Calcium: 9 mg/dL (ref 8.7–10.2)
Chloride: 103 mmol/L (ref 96–106)
Creatinine, Ser: 0.66 mg/dL (ref 0.57–1.00)
GFR calc Af Amer: 118 mL/min/1.73
GFR calc non Af Amer: 103 mL/min/1.73
Glucose: 98 mg/dL (ref 65–99)
Potassium: 4.1 mmol/L (ref 3.5–5.2)
Sodium: 140 mmol/L (ref 134–144)

## 2018-05-17 LAB — CBC
HEMATOCRIT: 44.6 % (ref 34.0–46.6)
Hemoglobin: 14.1 g/dL (ref 11.1–15.9)
MCH: 30.3 pg (ref 26.6–33.0)
MCHC: 31.6 g/dL (ref 31.5–35.7)
MCV: 96 fL (ref 79–97)
Platelets: 264 10*3/uL (ref 150–450)
RBC: 4.65 x10E6/uL (ref 3.77–5.28)
RDW: 13.8 % (ref 12.3–15.4)
WBC: 6.6 10*3/uL (ref 3.4–10.8)

## 2018-05-17 LAB — THYROID PANEL WITH TSH
FREE THYROXINE INDEX: 2 (ref 1.2–4.9)
T3 UPTAKE RATIO: 25 % (ref 24–39)
T4, Total: 7.8 ug/dL (ref 4.5–12.0)
TSH: 0.761 u[IU]/mL (ref 0.450–4.500)

## 2018-05-17 LAB — LIPID PANEL
Chol/HDL Ratio: 3 ratio (ref 0.0–4.4)
Cholesterol, Total: 192 mg/dL (ref 100–199)
HDL: 64 mg/dL
LDL Calculated: 97 mg/dL (ref 0–99)
Triglycerides: 157 mg/dL — ABNORMAL HIGH (ref 0–149)
VLDL Cholesterol Cal: 31 mg/dL (ref 5–40)

## 2018-05-19 ENCOUNTER — Telehealth (INDEPENDENT_AMBULATORY_CARE_PROVIDER_SITE_OTHER): Payer: Self-pay

## 2018-05-19 NOTE — Telephone Encounter (Signed)
Called patient using pacific interpreter [phone])Patients phone not in service. Olivia Rocha Olivia Rocha, CMA

## 2018-05-19 NOTE — Telephone Encounter (Signed)
-----   Message from Claiborne RiggZelda W Fleming, NP sent at 05/18/2018 11:11 PM EDT ----- Labs are essentially normal. Make sure you are drinking at least 48 oz of water per day. Work on eating a low fat, heart healthy diet and participate in regular aerobic exercise program to control as well. Exercise at least 150 minutes per week. Thyroid studies are normal.

## 2018-06-02 ENCOUNTER — Encounter (INDEPENDENT_AMBULATORY_CARE_PROVIDER_SITE_OTHER): Payer: Self-pay

## 2018-06-02 ENCOUNTER — Telehealth (INDEPENDENT_AMBULATORY_CARE_PROVIDER_SITE_OTHER): Payer: Self-pay

## 2018-06-02 NOTE — Telephone Encounter (Signed)
Called patient and received the following message " We're sorry your call can not be completed as dialed. Please hang up and try your call again later." Results mailed/unable to reach letter mailed to patient. Olivia Rocha, CMA

## 2018-06-02 NOTE — Telephone Encounter (Signed)
-----   Message from Zelda W Fleming, NP sent at 05/18/2018 11:11 PM EDT ----- Labs are essentially normal. Make sure you are drinking at least 48 oz of water per day. Work on eating a low fat, heart healthy diet and participate in regular aerobic exercise program to control as well. Exercise at least 150 minutes per week. Thyroid studies are normal. 

## 2018-06-16 ENCOUNTER — Ambulatory Visit (INDEPENDENT_AMBULATORY_CARE_PROVIDER_SITE_OTHER): Payer: BLUE CROSS/BLUE SHIELD | Admitting: Physician Assistant

## 2018-07-28 ENCOUNTER — Ambulatory Visit (INDEPENDENT_AMBULATORY_CARE_PROVIDER_SITE_OTHER): Payer: BLUE CROSS/BLUE SHIELD | Admitting: Physician Assistant

## 2018-09-26 ENCOUNTER — Ambulatory Visit (INDEPENDENT_AMBULATORY_CARE_PROVIDER_SITE_OTHER): Payer: BLUE CROSS/BLUE SHIELD | Admitting: Physician Assistant

## 2018-09-26 ENCOUNTER — Encounter (INDEPENDENT_AMBULATORY_CARE_PROVIDER_SITE_OTHER): Payer: Self-pay | Admitting: Physician Assistant

## 2018-09-26 VITALS — BP 109/75 | HR 69 | Temp 97.9°F | Ht <= 58 in | Wt 123.6 lb

## 2018-09-26 DIAGNOSIS — G8929 Other chronic pain: Secondary | ICD-10-CM

## 2018-09-26 DIAGNOSIS — Z23 Encounter for immunization: Secondary | ICD-10-CM

## 2018-09-26 DIAGNOSIS — M546 Pain in thoracic spine: Secondary | ICD-10-CM | POA: Diagnosis not present

## 2018-09-26 MED ORDER — ACETAMINOPHEN 500 MG PO TABS: 1000.0000 mg | ORAL_TABLET | Freq: Three times a day (TID) | ORAL | 2 refills | Status: DC | PRN

## 2018-09-26 MED ORDER — NAPROXEN 500 MG PO TABS: 500.0000 mg | ORAL_TABLET | Freq: Two times a day (BID) | ORAL | 2 refills | Status: DC

## 2018-09-26 NOTE — Patient Instructions (Signed)

## 2018-09-26 NOTE — Progress Notes (Signed)
Subjective:  Patient ID: Olivia Rocha, female    DOB: September 01, 1966  Age: 52 y.o. MRN: [id number]  CC: back pain  HPI Olivia Rocha is a 52 y.o. female with a medical history of prediabetes and varicose veins of legs presents with complaint of lower back pain s of back pain since a fall at work approximately four months ago on 06/22/18. Went to [facility] on on 08/23/18 and had MRI of lumbar spine done which revealed "Acute-subacute mild superior endplate compression fracture of T12 with 19 percent anterior body height loss and mild retropulsion of superior endplate without significant spinal canal stenosis. 2. 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 Mild multilevel bulging disc without significant spinal canal stenosis or neural foraminal narrowing. Mild levoconvex lumbar scoliosis, apex centered at L3". Pt was told by [facility] that she should see her PCP for referral to orthopedics. Pt continues with moderate pain of the Lower T spine and of the L spine at the midline. Worse with prolonged standing and heavy lifting at work. Pt unable to answer which movements aggravate her back pain despite the use of interpreter and after being asked four times. Does not endorse any other symptoms or complaints to include neurological deficits or radiculopathy of pain.       Outpatient Medications Prior to Visit  Medication Sig Dispense Refill  . Elastic Bandages & Supports (MEDICAL COMPRESSION STOCKINGS) MISC 30 mmHg pressuer (Patient not taking: Reported on 05/16/2018) 2 each 0  . ibuprofen (ADVIL,MOTRIN) 600 MG tablet Take 1 tablet (600 mg total) by mouth every 6 (six) hours as needed. 30 tablet 1  . tiZANidine (ZANAFLEX) 4 MG tablet Take 1 tablet (4 mg total) by mouth every 6 (six) hours as needed for muscle spasms. 60 tablet 1   No facility-administered medications prior to visit.      ROS Review of Systems   Constitutional: Negative for chills, fever and malaise/fatigue.  Eyes: Negative for blurred vision.  Respiratory: Negative for shortness of breath.   Cardiovascular: Negative for chest pain and palpitations.  Gastrointestinal: Negative for abdominal pain and nausea.  Genitourinary: Negative for dysuria and hematuria.  Musculoskeletal: Positive for back pain. Negative for joint pain and myalgias.  Skin: Negative for rash.  Neurological: Negative for tingling and headaches.  Psychiatric/Behavioral: Negative for depression. The patient is not nervous/anxious.     Objective:  Ht 4\' 7"  (1.397 m)   Wt 123 lb 9.6 oz (56.1 kg)   LMP 07/20/2014   BMI 28.73 kg/m   BP/Weight 09/26/2018 05/16/2018 04/14/2016  Systolic BP - 121 119  Diastolic BP - 83 75  Wt. (Lbs) 123.6 123.2 -  BMI 28.73 28.63 -      Physical Exam  Constitutional: She is oriented to person, place, and time.  Well developed, well nourished, NAD, polite  HENT:  Head: Normocephalic and atraumatic.  Eyes: No scleral icterus.  Neck: Normal range of motion.  Cardiovascular: Normal rate, regular rhythm and normal heart sounds.  Pulmonary/Chest: Effort normal and breath sounds normal.  Musculoskeletal: She exhibits no edema.  Full aROM. TTP along the vertebrae of T12, L1, L2, L3, and L4. No paraspinal spasm or tenderness.  Neurological: She is alert and oriented to person, place, and time.  Skin: Skin is warm and dry. No rash noted. No erythema. No pallor.  Psychiatric: She has a normal mood and affect. Her behavior is normal. Thought content normal.  Vitals reviewed.  Assessment & Plan:   1. Chronic midline thoracic back pain - AMB referral to orthopedics  2. Need for prophylactic vaccination and inoculation against influenza - Flu Vaccine QUAD 6+ mos PF IM (Fluarix Quad PF)  3. Need for Tdap vaccination - Tdap vaccine greater than or equal to 7yo IM   Meds ordered this encounter  Medications  . naproxen  (NAPROSYN) 500 MG tablet    Sig: Take 1 tablet (500 mg total) by mouth 2 (two) times daily with a meal.    Dispense:  30 tablet    Refill:  2    Order Specific Question:   Supervising Provider    Answer:   Hoy Register [[id number]]  . acetaminophen (TYLENOL) 500 MG tablet    Sig: Take 2 tablets (1,000 mg total) by mouth every 8 (eight) hours as needed.    Dispense:  42 tablet    Refill:  2    Order Specific Question:   Supervising Provider    Answer:   Hoy Register [[id number]]    Follow-up: Return in about 8 weeks (around 11/21/2018) for back pain.   Loletta Specter PA

## 2018-11-21 ENCOUNTER — Ambulatory Visit (INDEPENDENT_AMBULATORY_CARE_PROVIDER_SITE_OTHER): Payer: BLUE CROSS/BLUE SHIELD | Admitting: Physician Assistant

## 2019-04-13 ENCOUNTER — Other Ambulatory Visit: Payer: Self-pay

## 2019-04-13 ENCOUNTER — Encounter (INDEPENDENT_AMBULATORY_CARE_PROVIDER_SITE_OTHER): Payer: Self-pay | Admitting: Physician Assistant

## 2019-04-13 ENCOUNTER — Ambulatory Visit (HOSPITAL_COMMUNITY)
Admission: EM | Admit: 2019-04-13 | Discharge: 2019-04-13 | Disposition: A | Discharge status: Home / Self Care | Payer: BLUE CROSS/BLUE SHIELD | Attending: Family Medicine | Admitting: Family Medicine

## 2019-04-13 ENCOUNTER — Encounter (HOSPITAL_COMMUNITY): Payer: Self-pay | Admitting: Emergency Medicine

## 2019-04-13 DIAGNOSIS — B349 Viral infection, unspecified: Secondary | ICD-10-CM | POA: Diagnosis not present

## 2019-04-13 DIAGNOSIS — R6883 Chills (without fever): Secondary | ICD-10-CM | POA: Diagnosis not present

## 2019-04-13 DIAGNOSIS — M791 Myalgia, unspecified site: Secondary | ICD-10-CM | POA: Diagnosis not present

## 2019-04-13 MED ORDER — IBUPROFEN 600 MG PO TABS: 600.0000 mg | ORAL_TABLET | Freq: Four times a day (QID) | ORAL | 0 refills | Status: DC | PRN

## 2019-04-13 MED ORDER — ACETAMINOPHEN 325 MG PO TABS
650.0000 mg | ORAL_TABLET | Freq: Once | ORAL | Status: AC
  Administered 2019-04-13: 13:00:00 650 mg via ORAL

## 2019-04-13 MED ORDER — ACETAMINOPHEN 325 MG PO TABS
ORAL_TABLET | ORAL | Status: AC
  Filled 2019-04-13: qty 2

## 2019-04-13 NOTE — ED Triage Notes (Signed)
Pt presents to [facility] for chills and feeling hot x 5 days.

## 2019-04-13 NOTE — ED Notes (Signed)
Patient able to ambulate independently  

## 2019-04-13 NOTE — Discharge Instructions (Addendum)
You have a viral illness  This is most likely COVID based on where you work and exposure.  Ibuprofen for pain and fever as needed Work note to stay out of work

## 2019-04-13 NOTE — ED Provider Notes (Signed)
[facility]    CSN: [id number] Arrival date & time: 04/13/19  1130     History   Chief Complaint No chief complaint on file.   HPI Olivia Rocha is a 53 y.o. female.   Patient is a 53 year old female who presents today with approximately 5 days of chills, body aches.  Symptoms have been constant, waxing waning.  She has been taking an unknown pain medication for her body aches.  Patient reporting that she does not read.  She works at the [employer].  She typically works Monday through Saturday.  Reports that she works around a lot of people but unknown of any sick contacts.  She has not taken her temperature but is felt hot.  Denies any cough, chest congestion, ear pain, sore throat.  No recent travels.  Entire HPI obtained with interpretor.   ROS per HPI      History reviewed. No pertinent past medical history.  There are no active problems to display for this patient.   Past Surgical History:  Procedure Laterality Date  . CESAREAN SECTION      OB History   No obstetric history on file.      Home Medications    Prior to Admission medications   Medication Sig Start Date End Date Taking? Authorizing Provider  ibuprofen (ADVIL) 600 MG tablet Take 1 tablet (600 mg total) by mouth every 6 (six) hours as needed. 04/13/19   Janace Aris, NP    Family History History reviewed. No pertinent family history.  Social History Social History   Tobacco Use  . Smoking status: Never Smoker  . Smokeless tobacco: Never Used  Substance Use Topics  . Alcohol use: Yes    Comment: "sometimes"  . Drug use: Never     Allergies   Patient has no known allergies.   Review of Systems Review of Systems   Physical Exam Triage Vital Signs ED Triage Vitals  Enc Vitals Group     BP 04/13/19 1151 110/72     Pulse Rate 04/13/19 1151 85     Resp 04/13/19 1151 18     Temp 04/13/19 1201 98.5 F (36.9 C)     Temp Source 04/13/19 1151 Oral    SpO2 04/13/19 1151 97 %     Weight --      Height --      Head Circumference --      Peak Flow --      Pain Score 04/13/19 1155 0     Pain Loc --      Pain Edu? --      Excl. in GC? --    No data found.  Updated Vital Signs BP 110/72 (BP Location: Right Arm)   Pulse 85   Temp 97.9 F (36.6 C) (Temporal)   Resp 18   SpO2 97%   Visual Acuity Right Eye Distance:   Left Eye Distance:   Bilateral Distance:    Right Eye Near:   Left Eye Near:    Bilateral Near:     Physical Exam Vitals signs and nursing note reviewed.  Constitutional:      General: She is not in acute distress.    Appearance: Normal appearance. She is ill-appearing. She is not toxic-appearing or diaphoretic.  HENT:     Head: Normocephalic and atraumatic.     Nose: Nose normal.  Eyes:     Conjunctiva/sclera: Conjunctivae normal.  Neck:     Musculoskeletal: Normal range of  motion.  Pulmonary:     Effort: Pulmonary effort is normal.  Musculoskeletal: Normal range of motion.  Skin:    General: Skin is warm and dry.  Neurological:     Mental Status: She is alert.  Psychiatric:        Mood and Affect: Mood normal.      UC Treatments / Results  Labs (all labs ordered are listed, but only abnormal results are displayed) Labs Reviewed - No data to display  EKG None  Radiology No results found.  Procedures Procedures (including critical care time)  Medications Ordered in UC Medications  acetaminophen (TYLENOL) tablet 650 mg (650 mg Oral Given by Other 04/13/19 1244)    Initial Impression / Assessment and Plan / UC Course  I have reviewed the triage vital signs and the nursing notes.  Pertinent labs & imaging results that were available during my care of the patient were reviewed by me and considered in my medical decision making (see chart for details).     Most likely symptoms are related to COVID 8419 Took pt out of work and told to stay home.  Tylenol here for pain Ibuprofen sent to  the pharmacy for fever and body aches.  Pt understanding and all instruction given with interpretor.   Final Clinical Impressions(s) / UC Diagnoses   Final diagnoses:  Viral illness     Discharge Instructions     You have a viral illness  This is most likely COVID based on where you work and exposure.  Ibuprofen for pain and fever as needed Work note to stay out of work     ED Prescriptions    Medication Sig Dispense Auth. Provider   ibuprofen (ADVIL) 600 MG tablet Take 1 tablet (600 mg total) by mouth every 6 (six) hours as needed. 30 tablet Dahlia ByesBast, Aarya Quebedeaux A, NP     Controlled Substance Prescriptions [place] Controlled Substance Registry consulted? Not Applicable   Janace ArisBast, Nayelli Inglis A, NP 04/13/19 1304

## 2019-12-24 ENCOUNTER — Other Ambulatory Visit: Payer: Self-pay

## 2019-12-24 ENCOUNTER — Encounter (HOSPITAL_COMMUNITY): Payer: Self-pay

## 2019-12-24 ENCOUNTER — Ambulatory Visit (HOSPITAL_COMMUNITY)
Admission: EM | Admit: 2019-12-24 | Discharge: 2019-12-24 | Disposition: A | Discharge status: Home / Self Care | Payer: BLUE CROSS/BLUE SHIELD | Attending: Family Medicine | Admitting: Family Medicine

## 2019-12-24 DIAGNOSIS — R21 Rash and other nonspecific skin eruption: Secondary | ICD-10-CM | POA: Diagnosis not present

## 2019-12-24 MED ORDER — PERMETHRIN 5 % EX CREA: TOPICAL_CREAM | CUTANEOUS | 1 refills | Status: DC

## 2019-12-24 MED ORDER — PREDNISONE 10 MG (21) PO TBPK: ORAL_TABLET | Freq: Every day | ORAL | 0 refills | Status: DC

## 2019-12-24 NOTE — ED Triage Notes (Signed)
Pt presents with recurrent rash issue that has been going on for a few weeks; pt states she also has a "delicate health" issue she did not wish to speak with me about.

## 2019-12-24 NOTE — ED Provider Notes (Signed)
[facility]   [id number] 12/24/19 Arrival Time: 1012  ASSESSMENT & PLAN:  1. Rash and nonspecific skin eruption     Begin: Meds ordered this encounter  Medications  . predniSONE (STERAPRED UNI-PAK 21 TAB) 10 MG (21) TBPK tablet    Sig: Take by mouth daily. Take as directed.    Dispense:  21 tablet    Refill:  0  . permethrin (ELIMITE) 5 % cream    Sig: Apply from neck down before bed then wash off in the morning. May repeat in one week.    Dispense:  60 g    Refill:  1    Work note given for today. See AVS for written information on scabies. Will follow up with PCP or here if worsening or failing to improve as anticipated. Reviewed expectations re: course of current medical issues. Questions answered. Outlined signs and symptoms indicating need for more acute intervention. Patient verbalized understanding. After Visit Summary given.   SUBJECTIVE: Phone interpreter used. Olivia Rocha is a 54 y.o. female who reports generalized itching for the past month. Started with small rash on lower extremities with itching. Now "itching all over". No specific aggravating or alleviating factors reported. No h/o similar. Without associated pain. Known trigger? No  New soaps/lotions/topicals/detergents? No  Environmental exposures? No  Contacts with similar? No  Recent travel? No  Other associated symptoms: none Therapies tried thus far: none Arthralgia or myalgia? none Recent illness? none Fever? none New medications? none No specific aggravating or alleviating factors reported.  ROS: As per HPI. All other systems negative.   OBJECTIVE: Vitals:   12/24/19 1056  BP: 131/84  Pulse: 73  Resp: 16  Temp: 98 F (36.7 C)  TempSrc: Oral  SpO2: 99%    General appearance: alert; no distress HEENT: [place]; AT Neck: supple with FROM Lungs: clear to auscultation bilaterally Heart: regular rate and rhythm Extremities: no edema; moves all extremities  normally Skin: warm and dry; non-specific rash/dry excoriated skin over lower extremities; no signs of skin infection; no urticaria Psychological: alert and cooperative; normal mood and affect  No Known Allergies   Social History   Socioeconomic History  . Marital status: Married    Spouse name: Not on file  . Number of children: Not on file  . Years of education: Not on file  . Highest education level: Not on file  Occupational History  . Not on file  Tobacco Use  . Smoking status: Never Smoker  . Smokeless tobacco: Never Used  Substance and Sexual Activity  . Alcohol use: Yes    Comment: "sometimes"  . Drug use: Never  . Sexual activity: Yes    Birth control/protection: None  Other Topics Concern  . Not on file  Social History Narrative   ** Merged History Encounter **       Social Determinants of Health   Financial Resource Strain:   . Difficulty of Paying Living Expenses: Not on file  Food Insecurity:   . Worried About Programme researcher, broadcasting/film/video in the Last Year: Not on file  . Ran Out of Food in the Last Year: Not on file  Transportation Needs:   . Lack of Transportation (Medical): Not on file  . Lack of Transportation (Non-Medical): Not on file  Physical Activity:   . Days of Exercise per Week: Not on file  . Minutes of Exercise per Session: Not on file  Stress:   . Feeling of Stress : Not on file  Social  Connections:   . Frequency of Communication with Friends and Family: Not on file  . Frequency of Social Gatherings with Friends and Family: Not on file  . Attends Religious Services: Not on file  . Active Member of Clubs or Organizations: Not on file  . Attends Archivist Meetings: Not on file  . Marital Status: Not on file  Intimate Partner Violence:   . Fear of Current or Ex-Partner: Not on file  . Emotionally Abused: Not on file  . Physically Abused: Not on file  . Sexually Abused: Not on file   FH: HTN  Past Surgical History:  Procedure  Laterality Date  . CESAREAN SECTION       Vanessa Kick, MD 12/24/19 [phone]

## 2020-01-17 ENCOUNTER — Ambulatory Visit (HOSPITAL_COMMUNITY)
Admission: RE | Admit: 2020-01-17 | Discharge: 2020-01-17 | Disposition: A | Discharge status: Home / Self Care | Payer: BC Managed Care – PPO | Source: Ambulatory Visit | Attending: Internal Medicine | Admitting: Internal Medicine

## 2020-01-17 ENCOUNTER — Encounter (HOSPITAL_COMMUNITY): Payer: Self-pay | Admitting: Emergency Medicine

## 2020-01-17 ENCOUNTER — Ambulatory Visit (HOSPITAL_COMMUNITY)
Admission: EM | Admit: 2020-01-17 | Discharge: 2020-01-17 | Disposition: A | Discharge status: Home / Self Care | Payer: BC Managed Care – PPO | Attending: Internal Medicine | Admitting: Internal Medicine

## 2020-01-17 ENCOUNTER — Other Ambulatory Visit: Payer: Self-pay

## 2020-01-17 ENCOUNTER — Telehealth (HOSPITAL_COMMUNITY): Payer: Self-pay | Admitting: Internal Medicine

## 2020-01-17 DIAGNOSIS — M7989 Other specified soft tissue disorders: Secondary | ICD-10-CM

## 2020-01-17 DIAGNOSIS — R609 Edema, unspecified: Secondary | ICD-10-CM | POA: Diagnosis not present

## 2020-01-17 DIAGNOSIS — M79605 Pain in left leg: Secondary | ICD-10-CM | POA: Insufficient documentation

## 2020-01-17 NOTE — Progress Notes (Signed)
VASCULAR LAB PRELIMINARY  PRELIMINARY  PRELIMINARY  PRELIMINARY  Left lower extremity venous duplex completed.    Preliminary report:  See CV proc for preliminary results.   Called report to Norlene Campbell, [facility], RVT 01/17/2020, 4:28 PM

## 2020-01-17 NOTE — Telephone Encounter (Signed)
I eventually got hold of the patient at 6 PM on 01/17/2020.  I spoke with a family member who understood [place].  I explained the results of the ultrasound with the family member and he translated for the patient.  I emphasized that he needs to go to the emergency department for further work-up.  The patient said she does not have a ride at this time so she cannot go to the emergency department.  I encouraged her to go to the emergency department when she is able to get her ride situation sorted out.

## 2020-01-17 NOTE — ED Provider Notes (Addendum)
[facility]    CSN: [id number] Arrival date & time: 01/17/20  1221      History   Chief Complaint Chief Complaint  Patient presents with  . Edema    HPI Olivia Rocha is a 54 y.o. female with history of varicose veins of the left leg comes to urgent care with complaints of worsening left leg swelling with increasing pain.  Patient says symptoms have been worsening recently.  She denies any trauma to the leg.  Swelling is back to the knee.  No ulcerations on the leg.  Pain is worsened by bearing weight on that leg.  No erythema.  No fever or chills.  No known relieving factors.  Patient denies any shortness of breath, cough or sputum production.  No chest pain. HPI  History reviewed. No pertinent past medical history.  Patient Active Problem List   Diagnosis Date Noted  . Varicose veins of left leg with edema 11/06/2015  . Tinea pedis of left foot 11/06/2015  . Low TSH level 09/22/2015  . Prediabetes 09/19/2015  . Leg pain, bilateral 09/19/2015  . Cervical high risk HPV (human papillomavirus) test positive 09/11/2015    Past Surgical History:  Procedure Laterality Date  . CESAREAN SECTION      OB History    Gravida  13   Para  12   Term  12   Preterm  0   AB  1   Living        SAB  1   TAB  0   Ectopic  0   Multiple      Live Births               Home Medications    Prior to Admission medications   Medication Sig Start Date End Date Taking? Authorizing Provider  acetaminophen (TYLENOL) 500 MG tablet Take 2 tablets (1,000 mg total) by mouth every 8 (eight) hours as needed. 09/26/18   Clent Demark, PA-C  ibuprofen (ADVIL) 600 MG tablet Take 1 tablet (600 mg total) by mouth every 6 (six) hours as needed. 04/13/19   Loura Halt A, NP  ibuprofen (ADVIL,MOTRIN) 600 MG tablet Take 1 tablet (600 mg total) by mouth every 6 (six) hours as needed. 05/16/18   Gildardo Pounds, NP    Family History History reviewed. No pertinent  family history.  Social History Social History   Tobacco Use  . Smoking status: Never Smoker  . Smokeless tobacco: Never Used  Substance Use Topics  . Alcohol use: Yes    Comment: "sometimes"  . Drug use: Never     Allergies   Patient has no known allergies.   Review of Systems Review of Systems  Constitutional: Negative for activity change, diaphoresis and fatigue.  Respiratory: Negative for cough, chest tightness, shortness of breath, wheezing and stridor.   Cardiovascular: Negative for chest pain and palpitations.  Gastrointestinal: Negative.   Musculoskeletal: Positive for joint swelling and myalgias. Negative for arthralgias and back pain.  Skin: Positive for wound.  Neurological: Negative for dizziness, light-headedness and headaches.  Psychiatric/Behavioral: Negative for decreased concentration and dysphoric mood.     Physical Exam Triage Vital Signs ED Triage Vitals  Enc Vitals Group     BP 01/17/20 1309 108/68     Pulse Rate 01/17/20 1309 76     Resp 01/17/20 1309 17     Temp 01/17/20 1309 97.7 F (36.5 C)     Temp Source 01/17/20 1309 Oral  SpO2 01/17/20 1309 97 %     Weight --      Height --      Head Circumference --      Peak Flow --      Pain Score 01/17/20 1259 10     Pain Loc --      Pain Edu? --      Excl. in GC? --    No data found.  Updated Vital Signs BP 108/68 (BP Location: Left Arm)   Pulse 76   Temp 97.7 F (36.5 C) (Oral)   Resp 17   SpO2 97%   Visual Acuity Right Eye Distance:   Left Eye Distance:   Bilateral Distance:    Right Eye Near:   Left Eye Near:    Bilateral Near:     Physical Exam Constitutional:      General: She is not in acute distress.    Appearance: Normal appearance. She is not ill-appearing or diaphoretic.  HENT:     Mouth/Throat:     Mouth: Mucous membranes are moist.     Pharynx: No oropharyngeal exudate or posterior oropharyngeal erythema.  Cardiovascular:     Rate and Rhythm: Normal rate  and regular rhythm.  Pulmonary:     Effort: Pulmonary effort is normal.     Breath sounds: Normal breath sounds.  Abdominal:     General: Bowel sounds are normal.     Palpations: Abdomen is soft.  Musculoskeletal:        General: Normal range of motion.     Cervical back: Normal range of motion.     Left lower leg: Edema present.  Lymphadenopathy:     Cervical: Cervical adenopathy present.  Skin:    General: Skin is warm.     Capillary Refill: Capillary refill takes less than 2 seconds.     Coloration: Skin is not jaundiced.     Findings: No bruising.  Neurological:     General: No focal deficit present.     Mental Status: She is alert and oriented to person, place, and time.      UC Treatments / Results  Labs (all labs ordered are listed, but only abnormal results are displayed) Labs Reviewed - No data to display  EKG   Radiology No results found.  Procedures Procedures (including critical care time)  Medications Ordered in UC Medications - No data to display  Initial Impression / Assessment and Plan / UC Course  I have reviewed the triage vital signs and the nursing notes.  Pertinent labs & imaging results that were available during my care of the patient were reviewed by me and considered in my medical decision making (see chart for details).     1.  Left leg swelling in the setting of varicose veins: DVT screen on an outpatient basis Over-the-counter Tylenol as needed for pain If DVT screen is negative, adherence to wearing compression stockings will be emphasized to the patient If DVT is positive, then patient will need anticoagulation Patient will get a DVT screen at the [facility], ED at 4 PM today. Final Clinical Impressions(s) / UC Diagnoses   Final diagnoses:  Left leg swelling   Discharge Instructions   None    ED Prescriptions    None     PDMP not reviewed this encounter.   Merrilee Jansky, MD 01/17/20 1610    Merrilee Jansky,  MD 01/17/20 [phone]

## 2020-01-17 NOTE — ED Notes (Signed)
Called Vascular Lab and scheduled appointment for LE Venous at 1600 today.  Information given to Theresa Mulligan RN

## 2020-01-17 NOTE — ED Notes (Signed)
Utilized Education officer, environmental for all communication.    Patient initially reluctant to keep appt (family transportation has to go to work)  Doctor, hospital to a son and patient Industrial/product designer.  Son and patient agreed to go to hospital for appt  appt is 4 pm today at [facility].

## 2020-01-17 NOTE — ED Triage Notes (Addendum)
Left leg painful and swelling.  Patient was seen for this before and told to return if no better.  Patient has a rash to this leg.  Pitting edema , 3+.  Swelling is below knee.   Patient has pain up to left hip

## 2020-01-17 NOTE — Telephone Encounter (Signed)
Attempted to call patient twice via every number stated on file.  Calls were unsuccessful when I called home phone and spouses phone. Third contact number on file went to a person who did not know the patient in question.

## 2020-01-21 ENCOUNTER — Other Ambulatory Visit: Payer: Self-pay

## 2020-01-21 ENCOUNTER — Encounter (HOSPITAL_COMMUNITY): Payer: Self-pay

## 2020-01-21 ENCOUNTER — Inpatient Hospital Stay (HOSPITAL_COMMUNITY)
Admission: EM | Admit: 2020-01-21 | Discharge: 2020-01-24 | DRG: 253 | Disposition: A | Discharge status: Home / Self Care | Payer: BC Managed Care – PPO | Attending: Internal Medicine | Admitting: Internal Medicine

## 2020-01-21 ENCOUNTER — Emergency Department (HOSPITAL_COMMUNITY): Payer: BC Managed Care – PPO

## 2020-01-21 ENCOUNTER — Ambulatory Visit (INDEPENDENT_AMBULATORY_CARE_PROVIDER_SITE_OTHER)
Admission: EM | Admit: 2020-01-21 | Discharge: 2020-01-21 | Disposition: A | Discharge status: Home / Self Care | Payer: BC Managed Care – PPO | Source: Home / Self Care | Attending: Family Medicine | Admitting: Family Medicine

## 2020-01-21 ENCOUNTER — Ambulatory Visit (HOSPITAL_BASED_OUTPATIENT_CLINIC_OR_DEPARTMENT_OTHER): Payer: BC Managed Care – PPO

## 2020-01-21 DIAGNOSIS — I82409 Acute embolism and thrombosis of unspecified deep veins of unspecified lower extremity: Secondary | ICD-10-CM | POA: Diagnosis present

## 2020-01-21 DIAGNOSIS — R609 Edema, unspecified: Secondary | ICD-10-CM

## 2020-01-21 DIAGNOSIS — Z20822 Contact with and (suspected) exposure to covid-19: Secondary | ICD-10-CM | POA: Diagnosis present

## 2020-01-21 DIAGNOSIS — Z95820 Peripheral vascular angioplasty status with implants and grafts: Secondary | ICD-10-CM | POA: Diagnosis not present

## 2020-01-21 DIAGNOSIS — I872 Venous insufficiency (chronic) (peripheral): Secondary | ICD-10-CM | POA: Diagnosis not present

## 2020-01-21 DIAGNOSIS — I82412 Acute embolism and thrombosis of left femoral vein: Principal | ICD-10-CM | POA: Diagnosis present

## 2020-01-21 DIAGNOSIS — I82452 Acute embolism and thrombosis of left peroneal vein: Secondary | ICD-10-CM | POA: Diagnosis not present

## 2020-01-21 DIAGNOSIS — L03116 Cellulitis of left lower limb: Secondary | ICD-10-CM | POA: Diagnosis present

## 2020-01-21 DIAGNOSIS — I871 Compression of vein: Secondary | ICD-10-CM | POA: Diagnosis present

## 2020-01-21 DIAGNOSIS — M79609 Pain in unspecified limb: Secondary | ICD-10-CM | POA: Diagnosis not present

## 2020-01-21 LAB — CBC WITH DIFFERENTIAL/PLATELET
Abs Immature Granulocytes: 0.04 10*3/uL (ref 0.00–0.07)
Basophils Absolute: 0.1 10*3/uL (ref 0.0–0.1)
Basophils Relative: 1 %
Eosinophils Absolute: 0.4 10*3/uL (ref 0.0–0.5)
Eosinophils Relative: 4 %
HCT: 46.3 % — ABNORMAL HIGH (ref 36.0–46.0)
Hemoglobin: 14.4 g/dL (ref 12.0–15.0)
Immature Granulocytes: 0 %
Lymphocytes Relative: 10 %
Lymphs Abs: 1.1 10*3/uL (ref 0.7–4.0)
MCH: 30.7 pg (ref 26.0–34.0)
MCHC: 31.1 g/dL (ref 30.0–36.0)
MCV: 98.7 fL (ref 80.0–100.0)
Monocytes Absolute: 0.9 10*3/uL (ref 0.1–1.0)
Monocytes Relative: 9 %
Neutro Abs: 7.9 10*3/uL — ABNORMAL HIGH (ref 1.7–7.7)
Neutrophils Relative %: 76 %
Platelets: 241 10*3/uL (ref 150–400)
RBC: 4.69 MIL/uL (ref 3.87–5.11)
RDW: 12.7 % (ref 11.5–15.5)
WBC: 10.4 10*3/uL (ref 4.0–10.5)
nRBC: 0 % (ref 0.0–0.2)

## 2020-01-21 LAB — RESPIRATORY PANEL BY RT PCR (FLU A&B, COVID)
Influenza A by PCR: NEGATIVE
Influenza B by PCR: NEGATIVE
SARS Coronavirus 2 by RT PCR: NEGATIVE

## 2020-01-21 LAB — COMPREHENSIVE METABOLIC PANEL
ALT: 29 U/L (ref 0–44)
AST: 30 U/L (ref 15–41)
Albumin: 3.1 g/dL — ABNORMAL LOW (ref 3.5–5.0)
Alkaline Phosphatase: 71 U/L (ref 38–126)
Anion gap: 10 (ref 5–15)
BUN: 7 mg/dL (ref 6–20)
CO2: 22 mmol/L (ref 22–32)
Calcium: 8.6 mg/dL — ABNORMAL LOW (ref 8.9–10.3)
Chloride: 102 mmol/L (ref 98–111)
Creatinine, Ser: 0.62 mg/dL (ref 0.44–1.00)
GFR calc Af Amer: >60 mL/min (ref >60)
GFR calc non Af Amer: >60 mL/min (ref >60)
Glucose, Bld: 155 mg/dL — ABNORMAL HIGH (ref 70–99)
Potassium: 4.4 mmol/L (ref 3.5–5.1)
Sodium: 134 mmol/L — ABNORMAL LOW (ref 135–145)
Total Bilirubin: 1 mg/dL (ref 0.3–1.2)
Total Protein: 6.7 g/dL (ref 6.5–8.1)

## 2020-01-21 LAB — HIV ANTIBODY (ROUTINE TESTING W REFLEX): HIV Screen 4th Generation wRfx: NONREACTIVE

## 2020-01-21 LAB — LACTIC ACID, PLASMA
Lactic Acid, Venous: 1.5 mmol/L (ref 0.5–1.9)
Lactic Acid, Venous: 1.5 mmol/L (ref 0.5–1.9)

## 2020-01-21 LAB — HEPARIN LEVEL (UNFRACTIONATED): Heparin Unfractionated: 0.92 IU/mL — ABNORMAL HIGH (ref 0.30–0.70)

## 2020-01-21 MED ORDER — ACETAMINOPHEN 325 MG PO TABS
650.0000 mg | ORAL_TABLET | Freq: Four times a day (QID) | ORAL | Status: DC | PRN
  Administered 2020-01-21 – 2020-01-24 (×4): 650 mg via ORAL
  Filled 2020-01-21 (×4): qty 2

## 2020-01-21 MED ORDER — HEPARIN (PORCINE) 25000 UT/250ML-% IV SOLN
800.0000 [IU]/h | INTRAVENOUS | Status: DC
  Administered 2020-01-21: 18:00:00 1000 [IU]/h via INTRAVENOUS
  Administered 2020-01-22: 18:00:00 800 [IU]/h via INTRAVENOUS
  Filled 2020-01-21 (×3): qty 250

## 2020-01-21 MED ORDER — ACETAMINOPHEN 650 MG RE SUPP: 650.0000 mg | Freq: Four times a day (QID) | RECTAL | Status: DC | PRN

## 2020-01-21 MED ORDER — VANCOMYCIN HCL 1500 MG/300ML IV SOLN
1500.0000 mg | INTRAVENOUS | Status: DC
  Administered 2020-01-21: 18:00:00 1500 mg via INTRAVENOUS
  Filled 2020-01-21: qty 300

## 2020-01-21 MED ORDER — IOHEXOL 350 MG/ML SOLN
100.0000 mL | Freq: Once | INTRAVENOUS | Status: AC | PRN
  Administered 2020-01-21: 100 mL via INTRAVENOUS

## 2020-01-21 MED ORDER — HEPARIN BOLUS VIA INFUSION
3500.0000 [IU] | Freq: Once | INTRAVENOUS | Status: AC
Start: 2020-01-21 — End: 2020-01-21
  Administered 2020-01-21: 3500 [IU] via INTRAVENOUS
  Filled 2020-01-21: qty 3500

## 2020-01-21 NOTE — ED Notes (Signed)
Vascular tech at bedside. °

## 2020-01-21 NOTE — ED Provider Notes (Signed)
Emergency Department Provider Note   I have reviewed the triage vital signs and the nursing notes.   HISTORY  Chief Complaint Leg Pain   HPI Olivia Rocha is a 54 y.o. female with PMH of left leg venou insufficiency presents to the emergency department with worsening left leg swelling and pain with some associated drainage.  Symptoms have been ongoing for the past 7 to 10 days.  The patient was seen  at [facility] urgent care on 1/28 with similar complaint.  DVT ultrasound performed which did not show an obvious DVT but exam was somewhat limited.  Patient has been using compression stockings but has had worsening pain and swelling.  She has noticed drainage over the past several days.  Denies fever, chills but has noticed some "fever in the leg." No abdominal pain, nausea, vomiting, diarrhea.   History reviewed. No pertinent past medical history.  Patient Active Problem List   Diagnosis Date Noted  . DVT (deep venous thrombosis) (HCC) 01/21/2020  . Varicose veins of left leg with edema 11/06/2015  . Tinea pedis of left foot 11/06/2015  . Low TSH level 09/22/2015  . Prediabetes 09/19/2015  . Leg pain, bilateral 09/19/2015  . Cervical high risk HPV (human papillomavirus) test positive 09/11/2015    Past Surgical History:  Procedure Laterality Date  . CESAREAN SECTION      Allergies Patient has no known allergies.  No family history on file.  Social History Social History   Tobacco Use  . Smoking status: Never Smoker  . Smokeless tobacco: Never Used  Substance Use Topics  . Alcohol use: Yes    Comment: "sometimes"  . Drug use: Never    Review of Systems  Constitutional: No fever/chills Cardiovascular: Denies chest pain. Respiratory: Denies shortness of breath. Gastrointestinal: No abdominal pain.  No nausea, no vomiting.  No diarrhea.   Musculoskeletal: Negative for back pain. Skin: Left leg pain and swelling with drainage.  Neurological: Negative  for headaches, focal weakness or numbness.  10-point ROS otherwise negative.  ____________________________________________   PHYSICAL EXAM:  VITAL SIGNS: ED Triage Vitals [01/21/20 1115]  Enc Vitals Group     BP 105/75     Pulse Rate 90     Resp 16     Temp 98.2 F (36.8 C)     Temp Source Oral     SpO2 96 %   Constitutional: Alert and oriented. Well appearing and in no acute distress. Eyes: Conjunctivae are normal.  Head: Atraumatic. Nose: No congestion/rhinnorhea. Mouth/Throat: Mucous membranes are moist.   Neck: No stridor.  Cardiovascular: Normal rate, regular rhythm. Good peripheral circulation. Grossly normal heart sounds.   Respiratory: Normal respiratory effort.  No retractions. Lungs CTAB. Gastrointestinal: Soft and nontender. No distention.  Musculoskeletal: Isolated left lower leg swelling with erythema.  Pitting edema is noted with honey crusted type discharge worse posteriorly and in the distal anterior leg.  No visible abscess. Right LE is unremarkable.  Neurologic:  Normal speech and language. No gross focal neurologic deficits are appreciated.  Skin:  Skin is warm and dry with LLE skin changes as below.    ____________________________________________   LABS (all labs ordered are listed, but only abnormal results are displayed)  Labs Reviewed  COMPREHENSIVE METABOLIC PANEL - Abnormal; Notable for the following components:      Result Value   Sodium 134 (*)    Glucose, Bld 155 (*)    Calcium 8.6 (*)    Albumin 3.1 (*)  All other components within normal limits  CBC WITH DIFFERENTIAL/PLATELET - Abnormal; Notable for the following components:   HCT 46.3 (*)    Neutro Abs 7.9 (*)    All other components within normal limits  RESPIRATORY PANEL BY RT PCR (FLU A&B, COVID)  LACTIC ACID, PLASMA  LACTIC ACID, PLASMA  HIV ANTIBODY (ROUTINE TESTING W REFLEX)  HEPARIN LEVEL (UNFRACTIONATED)  HEPARIN LEVEL (UNFRACTIONATED)  CBC  BASIC METABOLIC PANEL    ____________________________________________  RADIOLOGY  CT VENOGRAM ABD/PEL  Result Date: 01/21/2020 CLINICAL DATA:  Abnormal lower extremity duplex EXAM: CT VENOGRAM OF THE ABDOMEN AND PELVIS AS WELL AS THE LOWER EXTREMITIES BILATERALLY. TECHNIQUE: Multidetector CT imaging of the abdomen and pelvis and bilateral lower extremitieswas performed using the standard protocol during bolus administration of intravenous contrast. Multiplanar CT image reconstructions and MIPs were obtained to evaluate the vascular anatomy. CONTRAST:  100mL OMNIPAQUE IOHEXOL 350 MG/ML SOLN COMPARISON:  By report from prior lower extremity venous duplex from earlier in the same day. FINDINGS: Vascular: The abdominal aorta and inferior vena cava are well visualized and within normal limits. Normal branching pattern of the abdominal aorta is seen. No aneurysmal dilatation is noted. The iliac veins are well visualized and within normal limits. There is a filling defect noted within the left common femoral vein consistent with the given clinical history of common femoral deep venous thrombosis. It is nonocclusive and occupies approximately 50% of the vein lumen posteriorly. The left superficial femoral and popliteal veins are widely patent. The infrapopliteal veins demonstrate evidence of thrombus within the peroneal veins just below the confluence of the posterior tibial and peroneal veins into the tibioperoneal trunk. This is best visualized on image number 163 of series 6. The more peripheral deep venous structures appear within normal limits. The lesser saphenous vein is enlarged and there are multiple varicosities identified involving the greater saphenous vein in the calf. No other definitive deep venous thrombosis is noted. The right lower extremity as visualized shows no deep venous thrombosis. The lower extremity arterial structures show no significant stenosis as visualized. Nonvascular: Hepatobiliary: The liver is diffusely  fatty infiltrated. The gallbladder is within normal limits. Pancreas: Within normal limits. Spleen: Within normal limits. Adrenal/kidneys: Adrenal glands are within normal limits. Kidneys demonstrate a normal enhancement pattern with normal excretion bilaterally. The bladder is well distended. Bowel: No obstructive or inflammatory changes of the bowel are seen. The appendix is not well visualized although no inflammatory changes are noted. The small bowel and stomach appear within normal limits. Lymphatic: Scattered small retroperitoneal lymph nodes are seen. None of these are significant by size criteria. Some small iliac chain nodes are seen with slightly more prominent inguinal lymph nodes identified on the left. These are likely reactive given the degree of peripheral inflammatory change in the left calf. Reproductive: The uterus and adnexa appear within normal limits. Musculoskeletal: Degenerative changes of the lumbar spine are seen. Old compression deformities are noted within the lumbar spine. Review of the MIP images confirms the above findings. IMPRESSION: VASCULAR: Thrombus is noted within the left common femoral vein as well as in the peroneal veins as described. These are nonocclusive in nature. Prominent lesser saphenous vein on the left with evidence of varicosities in the left calf. NONVASCULAR: Fatty infiltration of the liver. Scattered prominent lymph nodes are noted within the left inguinal region likely reactive in nature given the degree of peripheral inflammatory change. No sizable retroperitoneal lymph nodes are noted. Chronic appearing compression deformities in the lumbar  spine. Electronically Signed   By: Inez Catalina M.D.   On: 01/21/2020 19:26   VAS [place] LOWER EXTREMITY VENOUS (DVT) ([facility] and [facility] 7a-7p)  Result Date: 01/21/2020  Lower Venous Study Indications: Pain, and Edema.  Limitations: Poor ultrasound/tissue interface and body habitus. Comparison Study: Prior left LEV 01-17-20,  negative. Performing Technologist: Baldwin Crown ARDMS, RVT  Examination Guidelines: A complete evaluation includes B-mode imaging, spectral Doppler, color Doppler, and power Doppler as needed of all accessible portions of each vessel. Bilateral testing is considered an integral part of a complete examination. Limited examinations for reoccurring indications may be performed as noted.  +-----+---------------+---------+-----------+----------+--------------+ RIGHTCompressibilityPhasicitySpontaneityPropertiesThrombus Aging +-----+---------------+---------+-----------+----------+--------------+ CFV  Full           Yes      Yes                                 +-----+---------------+---------+-----------+----------+--------------+   +---------+---------------+---------+-----------+----------+--------------+ LEFT     CompressibilityPhasicitySpontaneityPropertiesThrombus Aging +---------+---------------+---------+-----------+----------+--------------+ CFV      Partial        Yes      Yes                  Acute          +---------+---------------+---------+-----------+----------+--------------+ SFJ      Full                                                        +---------+---------------+---------+-----------+----------+--------------+ FV Prox  Full                                                        +---------+---------------+---------+-----------+----------+--------------+ FV Mid   Full                                                        +---------+---------------+---------+-----------+----------+--------------+ FV DistalFull                                                        +---------+---------------+---------+-----------+----------+--------------+ PFV      Full                                                        +---------+---------------+---------+-----------+----------+--------------+ POP      Full           Yes      Yes                                  +---------+---------------+---------+-----------+----------+--------------+ PTV      Full                                                        +---------+---------------+---------+-----------+----------+--------------+  PERO     Full                                                        +---------+---------------+---------+-----------+----------+--------------+ EIV      Partial                                                     +---------+---------------+---------+-----------+----------+--------------+ Poorly visualized calf veins due to pitting edema. IVC not visualized. Hypoechoic round structure with echogenic center seen in left groin mesuring 2.7 x 1.3 x 1.2 cm, possibly enlarged lymph node.    Summary: Right: No evidence of common femoral vein obstruction. Left: Findings consistent with acute deep vein thrombosis involving the left common femoral vein, and EIV. No cystic structure found in the popliteal fossa. Poorly visualized calf veins due to pitting edema. Hypoechoic round structure with echogenic center seen in left groin mesuring 2.7 x 1.3 x 1.2 cm, possibly enlarged lymph node.Left calf fluid seen on prior exam not seen today.  *See table(s) above for measurements and observations. Electronically signed by Fabienne Bruns MD on 01/21/2020 at 5:03:20 PM.    Final     ____________________________________________   PROCEDURES  Procedure(s) performed:   Procedures  None ____________________________________________   INITIAL IMPRESSION / ASSESSMENT AND PLAN / ED COURSE  Pertinent labs & imaging results that were available during my care of the patient were reviewed by me and considered in my medical decision making (see chart for details).   Patient presents to the emergency department for evaluation of left lower leg swelling with pain.  She does have some discharge noted which seems mostly consistent with her known venous insufficiency.  DVT  ultrasound limited from 1/28 but no visible DVT.  She may be developing a secondary infection. Plan to follow labs and reassess. Will repeat DVT study with limited evaluation previously.   DVT ultrasound shows clot in the proximal femoral vein on the left with abnormal lymph node in that area.  This was not visualized during the last ultrasound from the 28th with clothing as the limitation.  Labs are largely reassuring.  The leg does look secondarily infected and I do plan to start heparin along with vancomycin.  Patient with increasing pain.  Plan for CT abdomen pelvis with venous phase runoff in the left lower extremity to evaluate the extent of proximal clot. Plan for admit for abx and pain mgmt. Patient is ok at rest but pain with ambulation is significantly limiting mobility. No CP/SOB symptoms to suspect PE. Attempted to reach out to vascular surgery but did not hear back. Will follow CT with runoff and primary team can consult as inpatient if desired.   Discussed patient's case with Internal Medicine to request admission. Patient and family (if present) updated with plan. Care transferred to Medicine service.  I reviewed all nursing notes, vitals, pertinent old records, EKGs, labs, imaging (as available).  ____________________________________________  FINAL CLINICAL IMPRESSION(S) / ED DIAGNOSES  Final diagnoses:  Acute deep vein thrombosis (DVT) of femoral vein of left lower extremity (HCC)  Cellulitis of left lower extremity     MEDICATIONS GIVEN DURING THIS VISIT:  Medications  heparin ADULT  infusion 100 units/mL (25000 units/275mL sodium chloride 0.45%) (1,000 Units/hr Intravenous New Bag/Given 01/21/20 1740)  vancomycin (VANCOREADY) IVPB 1500 mg/300 mL (0 mg Intravenous Stopped 01/21/20 1938)  acetaminophen (TYLENOL) tablet 650 mg (650 mg Oral Given 01/21/20 1941)    Or  acetaminophen (TYLENOL) suppository 650 mg ( Rectal See Alternative 01/21/20 1941)  heparin bolus via infusion 3,500  Units (3,500 Units Intravenous Bolus from Bag 01/21/20 1740)  iohexol (OMNIPAQUE) 350 MG/ML injection 100 mL (100 mLs Intravenous Contrast Given 01/21/20 1632)    Note:  This document was prepared using Dragon voice recognition software and may include unintentional dictation errors.  Alona Bene, MD, [facility] Emergency Medicine    Naphtali Zywicki, Arlyss Repress, MD 01/21/20 2117

## 2020-01-21 NOTE — ED Triage Notes (Signed)
Pt accompanied by family member who reports left leg pain, swelling and drainage, ongoing for a few weeks. Yellow drainage noted, 3+ pitting edema, redness noted.

## 2020-01-21 NOTE — ED Notes (Signed)
Patient has arrived in the wrong location. Due to language barriers, patient family brought the patient here for care but due to the presence of a DVT the pt is in need of care at the Emergency Department. Pt transported via wheelchair.

## 2020-01-21 NOTE — H&P (Signed)
Date: 01/21/2020               Patient Name:  Olivia Rocha MRN: [id number]  DOB: 08-29-1966 Age / Sex: 54 y.o., female   PCP: Patient, No Pcp Per         Medical Service: Internal Medicine Teaching Service         Attending Physician: Dr. Jacqulyn Bath Arlyss Repress, MD    First Contact: Dr. Barbaraann Faster Pager: [phone]  Second Contact: Dr. Gwyneth Revels  Pager: [phone]       After Hours (After 5p/  First Contact Pager: [phone]  weekends / holidays): Second Contact Pager: [phone]   Chief Complaint: Left lower extremity swelling and pain  History of Present Illness: Olivia Rocha 54 year old female without significant past medical history who presents with left lower extremity swelling, pain, and drainage. Patients son is present who translates.   Patient states she has had edema in her left leg since she was pregnant with her first 2 children, this was many years ago. She had it treated from people (not doctors) in the community when she was in [place].. She can not explain exactly what they did, but it is clear they did not give her oral medications.  People would put something around the area which would help her leg. When she moved here she reports that her leg continued to worsen over the years. She works at [employer] and reports she has not been able to work for 2 weeks. Reports that she has been sitting for longer times since being at home. Denies any recent travel. Never had issues with her right leg or other areas of swelling. Denies any previous surgeries or injury to left leg.She reports the redness started around this time when she stopped working. Denies any fevers or chills. Denies chest pain, abdominal pain, constipation, or recent illness.    Meds:  No outpatient medications have been marked as taking for the 01/21/20 encounter [facility] Encounter).    Allergies: Allergies as of 01/21/2020  . (No Known Allergies)   History reviewed. No pertinent past medical  history.  Family History: No family history of cardiac, malignancy, sickle cell, or blood clotting issues.   Social History: Denies any smoking or drug use. Drinks EtOH occasionally, about 1 bottle of beer every few weeks. Not on any herbal medications at home.   Review of Systems: A complete ROS was negative except as per HPI.   Physical Exam: Blood pressure 104/65, pulse 86, temperature 98.2 F (36.8 C), temperature source Oral, resp. rate 15, height 4\' 7"  (1.397 m), weight 60 kg, SpO2 98 %.  General: NAD, resting in bed HE: [place]/AT, sclera antiicteric  ENT: No lymphadenopathy, trachea midline Cardiovascular: RRR, no MRG Pulmonary : CTAB, no wheezes or rhonchi Abdominal: non tender, bowel sound present Musculoskeletal: left lower extremitie with pitting edema, erythema from knee down, honey crusted discharge with chronic venous stasis changes Skin: warm, dry, with skin changes as above Neurological: Alert, moving all 4 extremities  Psychiatric/Behavioral: normal affect, normal mood   Vas Lower extremity 01/20/2019 Right: No evidence of common femoral vein obstruction.  Left: Findings consistent with acute deep vein thrombosis involving the  left common femoral vein, and EIV. No cystic structure found in the  popliteal fossa. Poorly visualized calf veins due to pitting edema.  Hypoechoic round structure with echogenic  center seen in left groin mesuring 2.7 x 1.3 x 1.2 cm, possibly enlarged  lymph node.Left calf fluid seen on  prior exam not seen today  CT Venogram : results pending    Assessment & Plan by Problem: Active Problems:   * No active hospital problems. *  Olivia Rocha  a 54 year old female without significant past medical history who presents with warm and erythematous left lower extremity.   #DVT Provoked , patient reports she has been sedentary the last couple of weeks due to worsening edema. Leg became warm and red the last few weeks. Large  proximal venous thrombus in the left femoral vein seen on u/s, noted abnormal lymph nodes. CT venogram ordered to better evaluate.   P: -Heparin drip   #Cellulitis Left leg is edematous, erythematous, and painful. Honeycomb crust on top of chronic venous stasis. Concerning for cellulitis. P: - Continue vancomycin - PRN tylenol for pain  Dispo: Admit patient to Inpatient with expected length of stay greater than 2 midnights.  Signed:  Tamsen Snider, MD PGY1

## 2020-01-21 NOTE — Progress Notes (Signed)
ANTICOAGULATION CONSULT NOTE - Initial Consult  Pharmacy Consult for heparin Indication: DVT  No Known Allergies  Patient Measurements: Height: 4\' 7"  (139.7 cm) Weight: 132 lb 4.4 oz (60 kg) IBW/kg (Calculated) : 34 Heparin Dosing Weight: TBW  Vital Signs: Temp: 98.2 F (36.8 C) (02/01 1115) Temp Source: Oral (02/01 1115) BP: 104/65 (02/01 1545) Pulse Rate: 86 (02/01 1415)  Labs: Recent Labs    01/21/20 1126  HGB 14.4  HCT 46.3*  PLT 241  CREATININE 0.62    Estimated Creatinine Clearance: 57 mL/min (by C-G formula based on SCr of 0.62 mg/dL).   Medical History: History reviewed. No pertinent past medical history.  Assessment: 71 YOF presenting with left leg pain, swelling and drainage.  DVT in left femoral vein.  Not on anticoagulation PTA, CBC wnl.    Goal of Therapy:  Heparin level 0.3-0.7 units/ml Monitor platelets by anticoagulation protocol: Yes   Plan:  Heparin 3500 units IV x 1, and gtt at 1000 units/hr F/u 6 hour heparin level  40, PharmD Clinical Pharmacist Please check AMION for all [facility] Pharmacy numbers 01/21/2020 3:53 PM

## 2020-01-21 NOTE — Progress Notes (Signed)
ANTICOAGULATION CONSULT NOTE  Pharmacy Consult for heparin Indication: DVT  No Known Allergies  Patient Measurements: Height: 4\' 7"  (139.7 cm) Weight: 132 lb 4.4 oz (60 kg) IBW/kg (Calculated) : 34 Heparin Dosing Weight: TBW  Vital Signs: Temp: 98.7 F (37.1 C) (02/01 2250) Temp Source: Oral (02/01 2250) BP: 94/62 (02/01 2250) Pulse Rate: 86 (02/01 2250)  Labs: Recent Labs    01/21/20 1126 01/21/20 2257  HGB 14.4  --   HCT 46.3*  --   PLT 241  --   HEPARINUNFRC  --  0.92*  CREATININE 0.62  --     Estimated Creatinine Clearance: 57 mL/min (by C-G formula based on SCr of 0.62 mg/dL).  Assessment: 54 y.o. female with DVT for heparin  Goal of Therapy:  Heparin level 0.3-0.7 units/ml Monitor platelets by anticoagulation protocol: Yes   Plan:  Decrease Heparin 900 units/hr Follow-up am labs.  40, PharmD, BCPS  01/21/2020 11:59 PM

## 2020-01-21 NOTE — ED Notes (Signed)
Pt transported to CT ?

## 2020-01-21 NOTE — Progress Notes (Signed)
Pharmacy Antibiotic Note  Olivia Rocha is a 54 y.o. female admitted on 01/21/2020 with cellulitis, L-leg pain/swelling/drainage also with DVT in same. WBC wnl, AF.   Pharmacy has been consulted for vancomycin dosing.    Plan: Vancomycin 1500 mg IV every 24 hours Goal AUC 400-550. Expected AUC: 508 SCr used: 0.8 Monitor renal function, clinical progression and LOT Vancomycin levels at steady state     Temp (24hrs), Avg:98.2 F (36.8 C), Min:98.2 F (36.8 C), Max:98.2 F (36.8 C)  Recent Labs  Lab 01/21/20 1126 01/21/20 1330  WBC 10.4  --   CREATININE 0.62  --   LATICACIDVEN 1.5 1.5    CrCl cannot be calculated (Unknown ideal weight.).    No Known Allergies  Daylene Posey, PharmD Clinical Pharmacist Please check AMION for all [facility] Pharmacy numbers 01/21/2020 3:22 PM

## 2020-01-21 NOTE — Progress Notes (Signed)
Left lower extremity venous duplex exam completed.  Results given to Dr. Jacqulyn Bath  Preliminary results can be found under CV proc under chart review.  01/21/2020 2:14 PM  Loyd Salvador, K., RDMS, RVT

## 2020-01-22 DIAGNOSIS — L03116 Cellulitis of left lower limb: Secondary | ICD-10-CM

## 2020-01-22 DIAGNOSIS — I82452 Acute embolism and thrombosis of left peroneal vein: Secondary | ICD-10-CM

## 2020-01-22 DIAGNOSIS — I82412 Acute embolism and thrombosis of left femoral vein: Principal | ICD-10-CM

## 2020-01-22 LAB — HEPARIN LEVEL (UNFRACTIONATED)
Heparin Unfractionated: 0.67 IU/mL (ref 0.30–0.70)
Heparin Unfractionated: 0.86 IU/mL — ABNORMAL HIGH (ref 0.30–0.70)
Heparin Unfractionated: 0.6 IU/mL (ref 0.30–0.70)

## 2020-01-22 LAB — CBC
HCT: 42.3 % (ref 36.0–46.0)
Hemoglobin: 13.6 g/dL (ref 12.0–15.0)
MCH: 31.1 pg (ref 26.0–34.0)
MCHC: 32.2 g/dL (ref 30.0–36.0)
MCV: 96.8 fL (ref 80.0–100.0)
Platelets: 244 10*3/uL (ref 150–400)
RBC: 4.37 MIL/uL (ref 3.87–5.11)
RDW: 12.7 % (ref 11.5–15.5)
WBC: 8.6 10*3/uL (ref 4.0–10.5)
nRBC: 0 % (ref 0.0–0.2)

## 2020-01-22 LAB — BASIC METABOLIC PANEL
Anion gap: 11 (ref 5–15)
BUN: 9 mg/dL (ref 6–20)
CO2: 20 mmol/L — ABNORMAL LOW (ref 22–32)
Calcium: 8 mg/dL — ABNORMAL LOW (ref 8.9–10.3)
Chloride: 109 mmol/L (ref 98–111)
Creatinine, Ser: 0.66 mg/dL (ref 0.44–1.00)
GFR calc Af Amer: >60 mL/min (ref >60)
GFR calc non Af Amer: >60 mL/min (ref >60)
Glucose, Bld: 131 mg/dL — ABNORMAL HIGH (ref 70–99)
Potassium: 3.5 mmol/L (ref 3.5–5.1)
Sodium: 140 mmol/L (ref 135–145)

## 2020-01-22 MED ORDER — OXYCODONE HCL 5 MG PO TABS
5.0000 mg | ORAL_TABLET | Freq: Four times a day (QID) | ORAL | Status: DC | PRN
  Administered 2020-01-22 – 2020-01-23 (×3): 5 mg via ORAL
  Filled 2020-01-22 (×3): qty 1

## 2020-01-22 MED ORDER — CEPHALEXIN 500 MG PO CAPS
500.0000 mg | ORAL_CAPSULE | Freq: Four times a day (QID) | ORAL | Status: DC
  Administered 2020-01-22 – 2020-01-24 (×10): 500 mg via ORAL
  Filled 2020-01-22 (×11): qty 1

## 2020-01-22 NOTE — Progress Notes (Signed)
   Subjective:   Interpretor used via ipad. Reviewed current treatment plan. Patient says her pain is controlled with PRN medication.   Objective:  Vital signs in last 24 hours: Vitals:   01/21/20 1745 01/21/20 1848 01/21/20 2250 01/22/20 0316  BP: 109/71 108/70 94/62 106/73  Pulse: 77 83 86 89  Resp: 19 (!) 22 18 20   Temp:  98.3 F (36.8 C) 98.7 F (37.1 C) 97.8 F (36.6 C)  TempSrc:  Oral Oral Oral  SpO2: 98% 100% 96% 94%  Weight:      Height:       General: NAD, resting with leg elevated HEENT:[place]/AT , sclera antiicteric  Cardiovascular: RRR, no MRG Pulmonary : CTAB, no wheezes or rhonchi, poor effort Abdominal: non- tender , bowel sounds present   Musculoskeletal: left lower extremities with pitting edema, erythema from knee down, honey crusted discharge with chronic venous stasis changes. :   CBC Latest Ref Rng & Units 01/21/2020 05/16/2018 08/21/2015  WBC 4.0 - 10.5 K/uL 10.4 6.6 7.3  Hemoglobin 12.0 - 15.0 g/dL 10/21/2015 19.5 09.3  Hematocrit 36.0 - 46.0 % 46.3(H) 44.6 42.3  Platelets 150 - 400 K/uL 241 264 224   BMP Latest Ref Rng & Units 01/21/2020 05/16/2018 08/21/2015  Glucose 70 - 99 mg/dL 10/21/2015) 98 124(P)  BUN 6 - 20 mg/dL 7 11 10   Creatinine 0.44 - 1.00 mg/dL 809(X 8.33  BUN/Creat Ratio 9 - 23 - 17 -  Sodium 135 - 145 mmol/L 134(L) 140 138  Potassium 3.5 - 5.1 mmol/L 4.4 4.1 4.0  Chloride 98 - 111 mmol/L 102 103 104  CO2 22 - 32 mmol/L 22 24 27   Calcium 8.9 - 10.3 mg/dL 8.25) 9.0 9.1   I/O last 3 completed shifts: In: -  Out: 500 [Urine:500] No intake/output data recorded.    Assessment/Plan:  Active Problems:   DVT (deep venous thrombosis) (HCC)  Alfonso 0.53 is a 54 year old female without significant past medical history who presents with left lower extremity DVT.   #DVT  Likely 2/2 to chronic venous stasis and recently sedentary due to cellulitis.  Patient describes having left lower extremity edema for many years concerning for May  Thurner syndrome. Review with radiology who appreciates a narrowing of iliac vein that could represent a remote DVT. The compromised blood flow could precipitate DVT. P: - Consult TOC for affordable oral anticoagulant .  - Consult IR  - Continue heparin until IR has evaluated patient  #Cellulitis Cellulitis appears non purulent.  - Keflex 500 mg 4 times dailey for 5 days  - PRN tylenol for moderate pain -PRN oxycodone 5 mg for severe pain   Prior to Admission Living Arrangement:home Anticipated Discharge Location:home Barriers to Discharge: Clinical improvement Dispo: Anticipated discharge in approximately 1-2 day(s).   Rogelia Mire, MD PGY1  See Attending Attestation Note for Final Recommendations.

## 2020-01-22 NOTE — Progress Notes (Signed)
ANTICOAGULATION CONSULT NOTE  Pharmacy Consult for heparin Indication: DVT  No Known Allergies  Patient Measurements: Height: 4\' 7"  (139.7 cm) Weight: 132 lb 4.4 oz (60 kg) IBW/kg (Calculated) : 34 Heparin Dosing Weight: TBW  Vital Signs: Temp: 99.2 F (37.3 C) (02/02 2115) Temp Source: Oral (02/02 2115) BP: 98/61 (02/02 2115) Pulse Rate: 83 (02/02 2115)  Labs: Recent Labs    01/21/20 1126 01/21/20 2257 01/22/20 0746 01/22/20 1424 01/22/20 2216  HGB 14.4  --  13.6  --   --   HCT 46.3*  --  42.3  --   --   PLT 241  --  244  --   --   HEPARINUNFRC  --    < > 0.67 0.86* 0.60  CREATININE 0.62  --  0.66  --   --    < > = values in this interval not displayed.    Estimated Creatinine Clearance: 57 mL/min (by C-G formula based on SCr of 0.66 mg/dL).  Assessment: 54 y.o. female with DVT for heparin  Goal of Therapy:  Heparin level 0.3-0.7 units/ml Monitor platelets by anticoagulation protocol: Yes   Plan:  Continue Heparin at current rate   40, PharmD, BCPS  01/22/2020 11:28 PM

## 2020-01-22 NOTE — Progress Notes (Signed)
Chief Complaint: Patient was seen in consultation today for LLE DVT at the request of Dr. Heide Spark  Referring Physician(s): Dr. Heide Spark   Supervising Physician: Richarda Overlie  Patient Status: [name] - In-pt  History of Present Illness: Olivia Rocha is a 54 y.o. female admitted with LLE swelling for the past few weeks. Her son is at bedside and able to interpret. He states she actually started having LLE swelling years ago back in [place]. It doesn't sound like she received any type of treatment. To his knowledge, she has never had blood clot nor been on any blodd thinner. She wears compression stockings but stands at work all day. She has been admitted and found to have acute Left CFV DVT and was placed on IV heparin. CT Venogram performed again identifies the clot as well as chronic stasis changes of the superficial system as well. She states her leg feels a little better since being started on the heparin and abx. IR is asked to consult for possible intervention. PMHx, meds, labs, imaging, allergies reviewed. Family at bedside.   History reviewed. No pertinent past medical history.  Past Surgical History:  Procedure Laterality Date  . CESAREAN SECTION      Allergies: Patient has no known allergies.  Medications: Prior to Admission medications   Medication Sig Start Date End Date Taking? Authorizing Provider  acetaminophen (TYLENOL) 500 MG tablet Take 2 tablets (1,000 mg total) by mouth every 8 (eight) hours as needed. Patient not taking: Reported on 01/21/2020 09/26/18   Loletta Specter, PA-C  ibuprofen (ADVIL) 600 MG tablet Take 1 tablet (600 mg total) by mouth every 6 (six) hours as needed. Patient not taking: Reported on 01/21/2020 04/13/19   Dahlia Byes A, NP  ibuprofen (ADVIL,MOTRIN) 600 MG tablet Take 1 tablet (600 mg total) by mouth every 6 (six) hours as needed. Patient not taking: Reported on 01/21/2020 05/16/18   Claiborne Rigg, NP     No family history on  file.  Social History   Socioeconomic History  . Marital status: Married    Spouse name: Not on file  . Number of children: Not on file  . Years of education: Not on file  . Highest education level: Not on file  Occupational History  . Not on file  Tobacco Use  . Smoking status: Never Smoker  . Smokeless tobacco: Never Used  Substance and Sexual Activity  . Alcohol use: Yes    Comment: "sometimes"  . Drug use: Never  . Sexual activity: Yes    Birth control/protection: None  Other Topics Concern  . Not on file  Social History Narrative   ** Merged History Encounter **       Social Determinants of Health   Financial Resource Strain:   . Difficulty of Paying Living Expenses: Not on file  Food Insecurity:   . Worried About Programme researcher, broadcasting/film/video in the Last Year: Not on file  . Ran Out of Food in the Last Year: Not on file  Transportation Needs:   . Lack of Transportation (Medical): Not on file  . Lack of Transportation (Non-Medical): Not on file  Physical Activity:   . Days of Exercise per Week: Not on file  . Minutes of Exercise per Session: Not on file  Stress:   . Feeling of Stress : Not on file  Social Connections:   . Frequency of Communication with Friends and Family: Not on file  . Frequency of Social Gatherings with Friends and  Family: Not on file  . Attends Religious Services: Not on file  . Active Member of Clubs or Organizations: Not on file  . Attends Banker Meetings: Not on file  . Marital Status: Not on file     Review of Systems: A 12 point ROS discussed and pertinent positives are indicated in the HPI above.  All other systems are negative.  Review of Systems  Vital Signs: BP 112/68 (BP Location: Right Arm)   Pulse (!) 102   Temp 98.7 F (37.1 C) (Oral)   Resp 16   Ht 4\' 7"  (1.397 m)   Wt 60 kg   SpO2 93%   BMI 30.74 kg/m   Physical Exam Constitutional:      Appearance: Normal appearance.  HENT:     Mouth/Throat:      Mouth: Mucous membranes are moist.     Pharynx: Oropharynx is clear.  Cardiovascular:     Rate and Rhythm: Normal rate and regular rhythm.     Pulses: Normal pulses.     Heart sounds: Normal heart sounds.  Pulmonary:     Effort: Pulmonary effort is normal. No respiratory distress.     Breath sounds: Normal breath sounds.  Musculoskeletal:     Comments: LLE with 2-3+ edema from the knee distally. Chronic brawny stasis changes with a few small weeping/bleeding ulcers too. Feet warm.  Skin:    General: Skin is warm and dry.  Neurological:     Mental Status: She is alert.     Imaging: CT VENOGRAM ABD/PEL  Result Date: 01/21/2020 CLINICAL DATA:  Abnormal lower extremity duplex EXAM: CT VENOGRAM OF THE ABDOMEN AND PELVIS AS WELL AS THE LOWER EXTREMITIES BILATERALLY. TECHNIQUE: Multidetector CT imaging of the abdomen and pelvis and bilateral lower extremitieswas performed using the standard protocol during bolus administration of intravenous contrast. Multiplanar CT image reconstructions and MIPs were obtained to evaluate the vascular anatomy. CONTRAST:  03/20/2020 OMNIPAQUE IOHEXOL 350 MG/ML SOLN COMPARISON:  By report from prior lower extremity venous duplex from earlier in the same day. FINDINGS: Vascular: The abdominal aorta and inferior vena cava are well visualized and within normal limits. Normal branching pattern of the abdominal aorta is seen. No aneurysmal dilatation is noted. The iliac veins are well visualized and within normal limits. There is a filling defect noted within the left common femoral vein consistent with the given clinical history of common femoral deep venous thrombosis. It is nonocclusive and occupies approximately 50% of the vein lumen posteriorly. The left superficial femoral and popliteal veins are widely patent. The infrapopliteal veins demonstrate evidence of thrombus within the peroneal veins just below the confluence of the posterior tibial and peroneal veins into the  tibioperoneal trunk. This is best visualized on image number 163 of series 6. The more peripheral deep venous structures appear within normal limits. The lesser saphenous vein is enlarged and there are multiple varicosities identified involving the greater saphenous vein in the calf. No other definitive deep venous thrombosis is noted. The right lower extremity as visualized shows no deep venous thrombosis. The lower extremity arterial structures show no significant stenosis as visualized. Nonvascular: Hepatobiliary: The liver is diffusely fatty infiltrated. The gallbladder is within normal limits. Pancreas: Within normal limits. Spleen: Within normal limits. Adrenal/kidneys: Adrenal glands are within normal limits. Kidneys demonstrate a normal enhancement pattern with normal excretion bilaterally. The bladder is well distended. Bowel: No obstructive or inflammatory changes of the bowel are seen. The appendix is not well visualized although  no inflammatory changes are noted. The small bowel and stomach appear within normal limits. Lymphatic: Scattered small retroperitoneal lymph nodes are seen. None of these are significant by size criteria. Some small iliac chain nodes are seen with slightly more prominent inguinal lymph nodes identified on the left. These are likely reactive given the degree of peripheral inflammatory change in the left calf. Reproductive: The uterus and adnexa appear within normal limits. Musculoskeletal: Degenerative changes of the lumbar spine are seen. Old compression deformities are noted within the lumbar spine. Review of the MIP images confirms the above findings. IMPRESSION: VASCULAR: Thrombus is noted within the left common femoral vein as well as in the peroneal veins as described. These are nonocclusive in nature. Prominent lesser saphenous vein on the left with evidence of varicosities in the left calf. NONVASCULAR: Fatty infiltration of the liver. Scattered prominent lymph nodes are  noted within the left inguinal region likely reactive in nature given the degree of peripheral inflammatory change. No sizable retroperitoneal lymph nodes are noted. Chronic appearing compression deformities in the lumbar spine. Electronically Signed   By: Alcide CleverMark  Lukens M.D.   On: 01/21/2020 19:26   VAS US LOWER EXTREMITY VENOUS (DVT) (MC and WL 7a-7p)  Result Date: 01/21/2020  Lower Venous Study Indications: Pain, and Edema.  Limitations: Poor ultrasound/tissue interface and body habitus. Comparison Study: Prior left LEV 01-17-20, negative. Performing Technologist: Kennedy BuckerKristy Siebrecht ARDMS, RVT  Examination Guidelines: A complete evaluation includes B-mode imaging, spectral Doppler, color Doppler, and power Doppler as needed of all accessible portions of each vessel. Bilateral testing is considered an integral part of a complete examination. Limited examinations for reoccurring indications may be performed as noted.  +-----+---------------+---------+-----------+----------+--------------+ RIGHTCompressibilityPhasicitySpontaneityPropertiesThrombus Aging +-----+---------------+---------+-----------+----------+--------------+ CFV  Full           Yes      Yes                                 +-----+---------------+---------+-----------+----------+--------------+   +---------+---------------+---------+-----------+----------+--------------+ LEFT     CompressibilityPhasicitySpontaneityPropertiesThrombus Aging +---------+---------------+---------+-----------+----------+--------------+ CFV      Partial        Yes      Yes                  Acute          +---------+---------------+---------+-----------+----------+--------------+ SFJ      Full                                                        +---------+---------------+---------+-----------+----------+--------------+ FV Prox  Full                                                         +---------+---------------+---------+-----------+----------+--------------+ FV Mid   Full                                                        +---------+---------------+---------+-----------+----------+--------------+ FV DistalFull                                                        +---------+---------------+---------+-----------+----------+--------------+  PFV      Full                                                        +---------+---------------+---------+-----------+----------+--------------+ POP      Full           Yes      Yes                                 +---------+---------------+---------+-----------+----------+--------------+ PTV      Full                                                        +---------+---------------+---------+-----------+----------+--------------+ PERO     Full                                                        +---------+---------------+---------+-----------+----------+--------------+ EIV      Partial                                                     +---------+---------------+---------+-----------+----------+--------------+ Poorly visualized calf veins due to pitting edema. IVC not visualized. Hypoechoic round structure with echogenic center seen in left groin mesuring 2.7 x 1.3 x 1.2 cm, possibly enlarged lymph node.    Summary: Right: No evidence of common femoral vein obstruction. Left: Findings consistent with acute deep vein thrombosis involving the left common femoral vein, and EIV. No cystic structure found in the popliteal fossa. Poorly visualized calf veins due to pitting edema. Hypoechoic round structure with echogenic center seen in left groin mesuring 2.7 x 1.3 x 1.2 cm, possibly enlarged lymph node.Left calf fluid seen on prior exam not seen today.  *See table(s) above for measurements and observations. Electronically signed by Fabienne Bruns MD on 01/21/2020 at 5:03:20 PM.    Final    LE VENOUS  Result  Date: 01/19/2020  Lower Venous Study Indications: Edema.  Comparison Study: No prior study on file. Performing Technologist: Sherren Kerns RVS  Examination Guidelines: A complete evaluation includes B-mode imaging, spectral Doppler, color Doppler, and power Doppler as needed of all accessible portions of each vessel. Bilateral testing is considered an integral part of a complete examination. Limited examinations for reoccurring indications may be performed as noted.  +---------+---------------+---------+-----------+----------+-------------------+ LEFT     CompressibilityPhasicitySpontaneityPropertiesThrombus Aging      +---------+---------------+---------+-----------+----------+-------------------+ CFV                                                   not visualized  secondary to                                                              clothing            +---------+---------------+---------+-----------+----------+-------------------+ SFJ                                                   not visualized                                                            secondary to                                                              clothing            +---------+---------------+---------+-----------+----------+-------------------+ FV Prox  Full                                                             +---------+---------------+---------+-----------+----------+-------------------+ FV Mid   Full                                                             +---------+---------------+---------+-----------+----------+-------------------+ FV DistalFull                                                             +---------+---------------+---------+-----------+----------+-------------------+ PFV      Full                                                              +---------+---------------+---------+-----------+----------+-------------------+ POP      Full                                                             +---------+---------------+---------+-----------+----------+-------------------+ PTV  Full                                                             +---------+---------------+---------+-----------+----------+-------------------+ PERO     Full                                                             +---------+---------------+---------+-----------+----------+-------------------+     Summary: Left: There is no evidence of deep vein thrombosis in the lower extremity. However, portions of this examination were limited- see technologist comments above. No cystic structure found in the popliteal fossa. Interstitial fluid noted in the calf. There are multiple areas of fluid noted medial and lateral calf, etiology unknown. Would suggest further imaging if clinically indicated.  *See table(s) above for measurements and observations. Electronically signed by Lemar LivingsBrandon Cain MD on 01/19/2020 at 8:43:53 AM.    Final     Labs:  CBC: Recent Labs    01/21/20 1126 01/22/20 0746  WBC 10.4 8.6  HGB 14.4 13.6  HCT 46.3* 42.3  PLT 241 244    COAGS: No results for input(s): INR, APTT in the last 8760 hours.  BMP: Recent Labs    01/21/20 1126 01/22/20 0746  NA 134* 140  K 4.4 3.5  CL 102 109  CO2 22 20*  GLUCOSE 155* 131*  BUN 7 9  CALCIUM 8.6* 8.0*  CREATININE 0.62 0.66  GFRNONAA >60 >60  GFRAA >60 >60    LIVER FUNCTION TESTS: Recent Labs    01/21/20 1126  BILITOT 1.0  AST 30  ALT 29  ALKPHOS 71  PROT 6.7  ALBUMIN 3.1*    TUMOR MARKERS: No results for input(s): AFPTM, CEA, CA199, CHROMGRNA in the last 8760 hours.  Assessment and Plan: LLE DVT Chronic venous insufficiency of both the deep and superficial systems. After review of her CT Venogram, she likely has stenosis of the left Iliac system. Feel best  to proceed with formal venogram while inpt. Continue heparin drip. Will make NPO p MN Discussed procedure with son who explained to the pt. They are in agreement. Pt may also benefit from inpt and/or outpt wound care consult for weeping ulcers.   Thank you for this interesting consult.  I greatly enjoyed meeting United AutoClementine Rocha and look forward to participating in their care.  A copy of this report was sent to the requesting provider on this date.  Electronically Signed: Brayton ElKevin Jayant Kriz, PA-C 01/22/2020, 3:57 PM   I spent a total of 30 minutes in face to face in clinical consultation, greater than 50% of which was counseling/coordinating care for LLE DVT and chronic swelling.

## 2020-01-22 NOTE — Progress Notes (Signed)
Date: 01/22/2020  Patient name: Olivia Rocha  Medical record number: [id number]  Date of birth: 05/10/1966   I have seen and evaluated [name] and discussed their care with the Residency Team.  In brief, patient is a 54 year old female without any significant past medical history presented to the ED with worsening left lower extremity swelling and pain.  History obtained from the patient via translator as well as from chart.  Patient states that she has had recurrent swelling in her left lower extremity for years since she was pregnant with her first 2 children.  She states that she went to traditional medicine people in [place] when she had this and they would wrap her leg which would help decrease the swelling.  After patient moved here she had recurrent swelling which has worsened over the past couple of years.  Over the last 2 weeks she noted worsening swelling and pain in her left leg and was unable to work.  Patient states that since the pain worsened 2 weeks ago she has been more sedentary at home as putting weight on the leg caused more pain.  No chest pain, no shortness of breath, no palpitations, no lightheadedness, no syncope, no focal weakness, no tingling or numbness, no headache, no blurry vision, no nausea or vomiting, no diarrhea, no fevers or chills.  Today patient states that she was given some medication for the pain in her leg which helped relieve her pain.  She still has persistent swelling in that leg and has difficulty ambulating secondary to pain.  PMHx, Fam Hx, and/or Soc Hx : As per resident note  Vitals:   01/22/20 0316 01/22/20 0802  BP: 106/73 112/68  Pulse: 89 (!) 102  Resp: 20 16  Temp: 97.8 F (36.6 C) 98.7 F (37.1 C)  SpO2: 94% 93%   General: Awake, alert, oriented x3, NAD CVS: Regular rate and rhythm, normal heart sounds Lungs: CTA bilaterally Abdomen: Soft, nontender, nondistended, normoactive bowel sounds Extremities: Left lower  extremity with 2-3+ pitting edema, increased local warmth as well as redness up to the knee.  Patient with some tenderness to palpation in the left lower extremity. Psych: Normal mood and affect Skin: Chronic venous stasis changes noted in the left lower extremity HEENT: Normocephalic, atraumatic  Assessment and Plan: I have seen and evaluated the patient as outlined above. I agree with the formulated Assessment and Plan as detailed in the residents' note, with the following changes:   1.  Left lower extremity DVT: -Patient presented to ED with worsening pain and swelling in her left lower extremity over the last 2 weeks.  Left lower extremity ultrasound showed acute DVT in the left common femoral vein.  Patient also had a CT venogram done which showed nonocclusive DVT in the left common femoral vein as well as thrombus in the peroneal veins. -On heparin drip currently.  Will transition patient to [place] today (likely Eliquis) -Continue pain control for now -No further work-up at this time  2.  Left lower extremity cellulitis: -Patient also noted to have increased local warmth and redness in her left lower extremity extending to her knee from her ankle and CT venogram shows reactive lymphadenopathy in her left groin consistent with left lower extremity cellulitis. -Patient was a started on vancomycin.  Will transition patient to oral Keflex today to complete a 5 to 7-day course -No further work-up at this time -We will attempt to ambulate patient today and if she is able to ambulate will likely  DC home today on oral antibiotics and oral blood thinners.  Earl Lagos, MD 2/2/202111:53 AM

## 2020-01-22 NOTE — Care Management (Signed)
Noted CM Consult for NOAC for DVT:  Both Eliquis and Xarelto should be covered by patient's insurance, and both have 30 day free trial cards available.  As pt has Psychiatric nurse, she will be eligible for $10/month copay card from either of these NOACs.  Once NOAC is chosen, will do formal benefit check to see if prior authorization is needed for med.   Quintella Baton, RN, BSN  Trauma/Neuro ICU Case Manager [phone]

## 2020-01-22 NOTE — Progress Notes (Addendum)
ANTICOAGULATION CONSULT NOTE  Pharmacy Consult for heparin Indication: DVT  No Known Allergies  Patient Measurements: Height: 4\' 7"  (139.7 cm) Weight: 132 lb 4.4 oz (60 kg) IBW/kg (Calculated) : 34 Heparin Dosing Weight: TBW  Vital Signs: Temp: 98.7 F (37.1 C) (02/02 0802) Temp Source: Oral (02/02 0802) BP: 112/68 (02/02 0802) Pulse Rate: 102 (02/02 0802)  Labs: Recent Labs    01/21/20 1126 01/21/20 2257 01/22/20 0746  HGB 14.4  --  13.6  HCT 46.3*  --  42.3  PLT 241  --  244  HEPARINUNFRC  --  0.92* 0.67  CREATININE 0.62  --  0.66    Estimated Creatinine Clearance: 57 mL/min (by C-G formula based on SCr of 0.66 mg/dL).  Assessment: 54 y.o. female with DVT for heparin, Heparin level 0.67 (in range)  Goal of Therapy:  Heparin level 0.3-0.7 units/ml Monitor platelets by anticoagulation protocol: Yes   Plan:  Continue Heparin at 900 units/hr Follow-up confirmatory heparin level in 6 hours  Daliya Parchment A. 40, PharmD, BCPS, FNKF Clinical Pharmacist [place] Please utilize Amion for appropriate phone number to reach the unit pharmacist [facility])   01/22/2020 9:48 AM   Addendum: HL 0.86, Decrease heparin to 800 units/hr and get Heparin level in 6 hours

## 2020-01-23 ENCOUNTER — Telehealth: Payer: Self-pay | Admitting: General Practice

## 2020-01-23 ENCOUNTER — Inpatient Hospital Stay (HOSPITAL_COMMUNITY): Payer: BC Managed Care – PPO

## 2020-01-23 DIAGNOSIS — I872 Venous insufficiency (chronic) (peripheral): Secondary | ICD-10-CM

## 2020-01-23 HISTORY — PX: IR US GUIDE VASC ACCESS LEFT: IMG2389

## 2020-01-23 HISTORY — PX: IR VENO/EXT/UNI LEFT: IMG675

## 2020-01-23 HISTORY — PX: IR TRANSCATH PLC STENT 1ST ART NOT LE CV CAR VERT CAR: IMG5443

## 2020-01-23 LAB — CBC
HCT: 41.3 % (ref 36.0–46.0)
Hemoglobin: 13.1 g/dL (ref 12.0–15.0)
MCH: 30.8 pg (ref 26.0–34.0)
MCHC: 31.7 g/dL (ref 30.0–36.0)
MCV: 96.9 fL (ref 80.0–100.0)
Platelets: 241 10*3/uL (ref 150–400)
RBC: 4.26 MIL/uL (ref 3.87–5.11)
RDW: 12.5 % (ref 11.5–15.5)
WBC: 7.3 10*3/uL (ref 4.0–10.5)
nRBC: 0 % (ref 0.0–0.2)

## 2020-01-23 LAB — HEPARIN LEVEL (UNFRACTIONATED): Heparin Unfractionated: 0.44 IU/mL (ref 0.30–0.70)

## 2020-01-23 MED ORDER — APIXABAN 5 MG PO TABS: 5.0000 mg | ORAL_TABLET | Freq: Two times a day (BID) | ORAL | Status: DC

## 2020-01-23 MED ORDER — SENNA 8.6 MG PO TABS: 1.0000 | ORAL_TABLET | Freq: Every day | ORAL | 0 refills | Status: AC

## 2020-01-23 MED ORDER — FENTANYL CITRATE (PF) 100 MCG/2ML IJ SOLN
INTRAMUSCULAR | Status: AC | PRN
  Administered 2020-01-23 (×4): 25 ug via INTRAVENOUS

## 2020-01-23 MED ORDER — APIXABAN 5 MG PO TABS
10.0000 mg | ORAL_TABLET | Freq: Two times a day (BID) | ORAL | Status: DC
  Filled 2020-01-23: qty 2

## 2020-01-23 MED ORDER — SENNA 8.6 MG PO TABS
1.0000 | ORAL_TABLET | Freq: Every day | ORAL | Status: DC
  Administered 2020-01-24: 10:00:00 8.6 mg via ORAL
  Filled 2020-01-23: qty 1

## 2020-01-23 MED ORDER — CEPHALEXIN 500 MG PO CAPS: 500.0000 mg | ORAL_CAPSULE | Freq: Four times a day (QID) | ORAL | 0 refills | Status: AC

## 2020-01-23 MED ORDER — IOHEXOL 300 MG/ML  SOLN: 100.0000 mL | Freq: Once | INTRAMUSCULAR | Status: DC | PRN

## 2020-01-23 MED ORDER — APIXABAN 5 MG PO TABS
10.0000 mg | ORAL_TABLET | Freq: Two times a day (BID) | ORAL | Status: DC
  Administered 2020-01-23 – 2020-01-24 (×2): 10 mg via ORAL
  Filled 2020-01-23 (×3): qty 2

## 2020-01-23 MED ORDER — ASPIRIN 81 MG PO CHEW
325.0000 mg | CHEWABLE_TABLET | Freq: Once | ORAL | Status: AC
  Administered 2020-01-23: 15:00:00 325 mg via ORAL
  Filled 2020-01-23: qty 5

## 2020-01-23 MED ORDER — OXYCODONE HCL 5 MG PO TABS: 5.0000 mg | ORAL_TABLET | Freq: Four times a day (QID) | ORAL | 0 refills | Status: DC | PRN

## 2020-01-23 MED ORDER — MIDAZOLAM HCL 2 MG/2ML IJ SOLN
INTRAMUSCULAR | Status: AC
  Filled 2020-01-23: qty 2

## 2020-01-23 MED ORDER — MIDAZOLAM HCL 2 MG/2ML IJ SOLN
INTRAMUSCULAR | Status: AC | PRN
  Administered 2020-01-23 (×4): 0.5 mg via INTRAVENOUS

## 2020-01-23 MED ORDER — LIDOCAINE HCL 1 % IJ SOLN
INTRAMUSCULAR | Status: AC
  Filled 2020-01-23: qty 20

## 2020-01-23 MED ORDER — FENTANYL CITRATE (PF) 100 MCG/2ML IJ SOLN
INTRAMUSCULAR | Status: AC
  Filled 2020-01-23: qty 2

## 2020-01-23 MED ORDER — APIXABAN 5 MG PO TABS: ORAL_TABLET | ORAL | 0 refills | Status: DC

## 2020-01-23 MED ORDER — OXYCODONE HCL 5 MG PO TABS: 5.0000 mg | ORAL_TABLET | Freq: Four times a day (QID) | ORAL | 0 refills | Status: AC | PRN

## 2020-01-23 MED ORDER — ASPIRIN EC 81 MG PO TBEC: 81.0000 mg | DELAYED_RELEASE_TABLET | Freq: Every day | ORAL | 0 refills | Status: DC

## 2020-01-23 MED ORDER — LIDOCAINE HCL 1 % IJ SOLN
INTRAMUSCULAR | Status: AC | PRN
  Administered 2020-01-23: 5 mL

## 2020-01-23 MED ORDER — IOHEXOL 300 MG/ML  SOLN
100.0000 mL | Freq: Once | INTRAMUSCULAR | Status: AC | PRN
  Administered 2020-01-23: 50 mL via INTRAVENOUS

## 2020-01-23 NOTE — Progress Notes (Signed)
Pt seen again, son at bedside, not much change in left leg. Plan to proceed with LLE venogram, discussed possible angioplasty/stent if suspected stenosis is found.  Risks and benefits of venogram with angioplasty were discussed with the patient including, but not limited to bleeding, infection, vascular injury or contrast induced renal failure.  This interventional procedure involves the use of X-rays and because of the nature of the planned procedure, it is possible that we will have prolonged use of X-ray fluoroscopy.  Potential radiation risks to you include (but are not limited to) the following: - A slightly elevated risk for cancer  several years later in life. This risk is typically less than 0.5% percent. This risk is low in comparison to the normal incidence of human cancer, which is 33% for women and 50% for men according to the [employer] Society. - Radiation induced injury can include skin redness, resembling a rash, tissue breakdown / ulcers and hair loss (which can be temporary or permanent).   The likelihood of either of these occurring depends on the difficulty of the procedure and whether you are sensitive to radiation due to previous procedures, disease, or genetic conditions.   IF your procedure requires a prolonged use of radiation, you will be notified and given written instructions for further action.  It is your responsibility to monitor the irradiated area for the 2 weeks following the procedure and to notify your physician if you are concerned that you have suffered a radiation induced injury.    All of the patient's questions were answered, patient is agreeable to proceed.  Consent signed and in chart.  Brayton El PA-C Interventional Radiology 01/23/2020 11:20 AM

## 2020-01-23 NOTE — Care Management (Signed)
Per Daren,and  Clifton James w/[facility]. Co-pay amount for Eliquis 92m. for 7 days bid  $135.00. Eliquis 5 mg. for 30 days  one a day.$135.00  [facility]  Mail order : [facility] $135.00 Eliquis 90 day supply.

## 2020-01-23 NOTE — Progress Notes (Signed)
Heparin gtt stopped at 16:00 per MD order. Will continue to monitor Pt. Rema Fendt, RN

## 2020-01-23 NOTE — Sedation Documentation (Signed)
Patient was moved from a supine position to a prone position to access the popliteal versus femoral.  Patient tolerated the move well and was able to assist.  Vital signs stable prior to transferring.

## 2020-01-23 NOTE — Progress Notes (Signed)
Transitions of Care Pharmacist Note  Olivia Rocha is a 54 y.o. female that has been diagnosed with DVT and will be prescribed Eliquis (apixaban) at discharge.   Patient Education: I provided the following education on 01/23/2020 to the patient: How to take the medication Described what the medication is Signs of bleeding Signs/symptoms of VTE and stroke  Answered their questions  Discharge Medications Plan: The patient wants to have their discharge medications filled by the Transitions of Care pharmacy rather than their usual pharmacy.  The discharge orders pharmacy has been changed to the Transitions of Care pharmacy, the patient will receive a phone call regarding co-pay, and their medications will be delivered by the Transitions of Care pharmacy.    Thank you,   Lulu Riding, PharmD PGY1 Pharmacy Resident  Please check AMION for all [facility] phone numbers After 10:00 PM, call Main Pharmacy [phone]  January 23, 2020

## 2020-01-23 NOTE — Procedures (Signed)
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  Access: Left lesser saphenous vein - keep left leg straight x 2 hrs (until 1530)  EBL: None No immediate complications.   Katherina Right, MD Pager #: [phone]

## 2020-01-23 NOTE — Progress Notes (Addendum)
   Subjective:   Interpretor used via ipad. Reviewed plan for IR to take patient for . Patient says her pain is controlled with PRN medication.  Son speaks [place] and [place] , obtained number from pt and will add to chart.   Objective:  Vital signs in last 24 hours: Vitals:   01/22/20 0802 01/22/20 2115 01/23/20 0515 01/23/20 0744  BP: 112/68 98/61 100/65 (!) 97/52  Pulse: (!) 102 83 78 76  Resp: 16 20 16 16   Temp: 98.7 F (37.1 C) 99.2 F (37.3 C) 98.9 F (37.2 C) 98 F (36.7 C)  TempSrc: Oral Oral Oral Oral  SpO2: 93% 95% 96% 95%  Weight:      Height:       General: NAD, resting with leg elevated HEENT:[place]/AT , sclera antiicteric  Cardiovascular: RRR, no MRG Pulmonary : CTAB, no wheezes or rhonchi, poor effort Abdominal: non- tender , bowel sounds present   Musculoskeletal: Left lower extremity edema, erythema improving.  Skin: Dry, scaly left lower extremity   CBC Latest Ref Rng & Units 01/23/2020 01/22/2020 01/21/2020  WBC 4.0 - 10.5 K/uL 7.3 8.6 10.4  Hemoglobin 12.0 - 15.0 g/dL 03/20/2020 42.3 53.6  Hematocrit 36.0 - 46.0 % 41.3 42.3 46.3(H)  Platelets 150 - 400 K/uL 241 244 241    Assessment/Plan:  Active Problems:   DVT (deep venous thrombosis) (HCC)  Olivia Rocha 14.4 is a 54 year old female without significant past medical history who presents with left lower extremity DVT.   #DVT  #Chronic venous stasis  First DVT patient is aware of. Provoked , Likely 2/2 to chronic venous stasis and recently sedentary due to cellulitis.  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  -  CT Venogram demonstrates abnormally small left external iliac vein.  P: - Consulted IR, greatly appreciate their consult. - Plan for left lower extremity/pelvic venogram , possible balloon angioplasty or stent with IR - Continue heparin , will switch to Eliquis after procedure. Appreciate TOC help with insurance approval.    #Cellulitis Cellulitis appears non purulent.  - Keflex 500 mg 4 times dailey for 5 days , stop date 01/26/2020 - PRN tylenol for moderate pain -PRN oxycodone 5 mg for severe pain   Prior to Admission Living Arrangement:home Anticipated Discharge Location:home Barriers to Discharge: Clinical improvement Dispo: Anticipated discharge in approximately 1-2 day(s).   03/25/2020, MD PGY1

## 2020-01-23 NOTE — Telephone Encounter (Signed)
[facility] est care per Dr Gwyneth Revels; pt appt. 01/30/19 1045am

## 2020-01-23 NOTE — Discharge Instructions (Addendum)
Olivia Rocha,  It is likely your venous stasis ( chronic leg swelling) , caused an infection called cellulitis in your leg and also precipitated a blood clot. The cause of your leg swelling was from a narrowing in one of your leg veins. The Interventional Radiologist did a procedure and opened up the narrowing. You will need to take aspirin daily for the next 3 months.   You were found to have a blood clot in your left leg and will need to take Apixiban ( Eliquis) daily for at least 6 months. This medication is to help prevent more clots from forming. Ask the pharmacist to go over the prescription if you have any questions.   In addition , you will have a prescription for Keflex, an antibiotic, for the infection in your leg.  I will send you home with 3 days of pain medication ( oxycodone) , only use this medication if you have to. In addition you should take the stool softer ( senna-docusate) while taking the pain medication.   Our staff at [facility] will call you to schedule a hospital follow up appointment.   My best,  Thurmon Fair, MD  Information on my medicine - ELIQUIS (apixaban)  This medication education was reviewed with me or my healthcare representative as part of my discharge preparation.  The pharmacist that spoke with me during my hospital stay was:  Yujing Z  Steenwyk,, RPH  Why was Eliquis prescribed for you? Eliquis was prescribed to treat blood clots that may have been found in the veins of your legs (deep vein thrombosis) or in your lungs (pulmonary embolism) and to reduce the risk of them occurring again.  What do You need to know about Eliquis ? The starting dose is 10 mg (two 5 mg tablets) taken TWICE daily for the FIRST SEVEN (7) DAYS, then on (enter date)  01/30/2020 the dose is reduced to ONE 5 mg tablet taken TWICE daily.  Eliquis may be taken with or without food.   Try to take the dose about the same time in the morning and in the  evening. If you have difficulty swallowing the tablet whole please discuss with your pharmacist how to take the medication safely.  Take Eliquis exactly as prescribed and DO NOT stop taking Eliquis without talking to the doctor who prescribed the medication.  Stopping may increase your risk of developing a new blood clot.  Refill your prescription before you run out.  After discharge, you should have regular check-up appointments with your healthcare provider that is prescribing your Eliquis.    What do you do if you miss a dose? If a dose of ELIQUIS is not taken at the scheduled time, take it as soon as possible on the same day and twice-daily administration should be resumed. The dose should not be doubled to make up for a missed dose.  Important Safety Information A possible side effect of Eliquis is bleeding. You should call your healthcare provider right away if you experience any of the following: ? Bleeding from an injury or your nose that does not stop. ? Unusual colored urine (red or dark brown) or unusual colored stools (red or black). ? Unusual bruising for unknown reasons. ? A serious fall or if you hit your head (even if there is no bleeding).  Some medicines may interact with Eliquis and might increase your risk of bleeding or clotting while on Eliquis. To help avoid this, consult your healthcare provider or  pharmacist prior to using any new prescription or non-prescription medications, including herbals, vitamins, non-steroidal anti-inflammatory drugs (NSAIDs) and supplements.  This website has more information on Eliquis (apixaban): http://www.eliquis.com/eliquis/home I understand that if any problems occur once I am at home I am to contact my physician.  I understand and acknowledge receipt of the instructions indicated above.    _____________________________________________                                                       Physician's or R.N.'s Signature                 Date/Time                        _____________________________________________                                                       Patient or Representative Signature         Date/Time

## 2020-01-23 NOTE — Progress Notes (Signed)
ANTICOAGULATION CONSULT NOTE  Pharmacy Consult for heparin Indication: DVT  No Known Allergies  Patient Measurements: Height: 4\' 7"  (139.7 cm) Weight: 132 lb 4.4 oz (60 kg) IBW/kg (Calculated) : 34 Heparin Dosing Weight: TBW  Vital Signs: Temp: 98 F (36.7 C) (02/03 0744) Temp Source: Oral (02/03 0744) BP: 97/52 (02/03 0744) Pulse Rate: 76 (02/03 0744)  Labs: Recent Labs    01/21/20 1126 01/21/20 2257 01/22/20 0746 01/22/20 0746 01/22/20 1424 01/22/20 2216 01/23/20 0652  HGB 14.4  --  13.6  --   --   --  13.1  HCT 46.3*  --  42.3  --   --   --  41.3  PLT 241  --  244  --   --   --  241  HEPARINUNFRC  --    < > 0.67   < > 0.86* 0.60 0.44  CREATININE 0.62  --  0.66  --   --   --   --    < > = values in this interval not displayed.    Estimated Creatinine Clearance: 57 mL/min (by C-G formula based on SCr of 0.66 mg/dL).  Assessment: 54 y.o. female with DVT for heparin, Heparin level 0.44 (in range)  Goal of Therapy:  Heparin level 0.3-0.7 units/ml Monitor platelets by anticoagulation protocol: Yes   Plan:  Continue Heparin at 800 units/hr Daily heparin level in AM  Olivia Rocha A. 40, PharmD, BCPS, FNKF Clinical Pharmacist [place] Please utilize Amion for appropriate phone number to reach the unit pharmacist [facility])   01/23/2020 9:30 AM

## 2020-01-23 NOTE — Progress Notes (Signed)
ANTICOAGULATION CONSULT NOTE  Pharmacy Consult for Heparin >> Apixaban Indication: DVT  No Known Allergies  Patient Measurements: Height: 4\' 7"  (139.7 cm) Weight: 132 lb 4.4 oz (60 kg) IBW/kg (Calculated) : 34  Vital Signs: Temp: 98.4 F (36.9 C) (02/03 1330) Temp Source: Oral (02/03 1330) BP: 130/82 (02/03 1330) Pulse Rate: 75 (02/03 1330)  Labs: Recent Labs    01/21/20 1126 01/21/20 2257 01/22/20 0746 01/22/20 0746 01/22/20 1424 01/22/20 2216 01/23/20 0652  HGB 14.4  --  13.6  --   --   --  13.1  HCT 46.3*  --  42.3  --   --   --  41.3  PLT 241  --  244  --   --   --  241  HEPARINUNFRC  --    < > 0.67   < > 0.86* 0.60 0.44  CREATININE 0.62  --  0.66  --   --   --   --    < > = values in this interval not displayed.    Estimated Creatinine Clearance: 57 mL/min (by C-G formula based on SCr of 0.66 mg/dL).  Assessment: 54 y.o. female with LLE DVT, who was receiving IV heparin infusion, now being transitioned to apixaban. Pt is S/P stent placement by IR this afternoon. Per RN, pt was scheduled for discharge this evening, but will stay overnight due to pain after procedure; no bleeding observed after procedure. TOC pharmacy has filled apixaban prescription for pt at discharge.  CBC WNL, Scr 0.66, TBW CrCl ~94 ml/min; renal function stable  Plan:  Heparin infusion has been discontinued by provider Apixaban 10 mg PO BID X 7 days (starting this evening), followed by apixaban 5 mg PO BID Monitor CBC, renal function Monitor for signs/symptoms of bleeding  40, PharmD, BCPS, [facility] Clinical Pharmacist 01/23/2020 3:57 PM

## 2020-01-23 NOTE — Care Management (Signed)
Will provide $10/month copay card for Eliquis for patient after activation; first month free, and TOC pharmacy has provided free 30 day card.    Quintella Baton, RN, BSN  Trauma/Neuro ICU Case Manager [phone]

## 2020-01-24 ENCOUNTER — Encounter (HOSPITAL_COMMUNITY): Payer: Self-pay

## 2020-01-24 ENCOUNTER — Other Ambulatory Visit: Payer: Self-pay | Admitting: Radiology

## 2020-01-24 DIAGNOSIS — Z95820 Peripheral vascular angioplasty status with implants and grafts: Secondary | ICD-10-CM

## 2020-01-24 DIAGNOSIS — I82402 Acute embolism and thrombosis of unspecified deep veins of left lower extremity: Secondary | ICD-10-CM

## 2020-01-24 DIAGNOSIS — I83892 Varicose veins of left lower extremities with other complications: Secondary | ICD-10-CM

## 2020-01-24 LAB — CBC
HCT: 41.6 % (ref 36.0–46.0)
Hemoglobin: 13.3 g/dL (ref 12.0–15.0)
MCH: 30.7 pg (ref 26.0–34.0)
MCHC: 32 g/dL (ref 30.0–36.0)
MCV: 96.1 fL (ref 80.0–100.0)
Platelets: 242 10*3/uL (ref 150–400)
RBC: 4.33 MIL/uL (ref 3.87–5.11)
RDW: 12.3 % (ref 11.5–15.5)
WBC: 7.3 10*3/uL (ref 4.0–10.5)
nRBC: 0 % (ref 0.0–0.2)

## 2020-01-24 LAB — BASIC METABOLIC PANEL
Anion gap: 12 (ref 5–15)
BUN: 5 mg/dL — ABNORMAL LOW (ref 6–20)
CO2: 26 mmol/L (ref 22–32)
Calcium: 8.8 mg/dL — ABNORMAL LOW (ref 8.9–10.3)
Chloride: 105 mmol/L (ref 98–111)
Creatinine, Ser: 0.54 mg/dL (ref 0.44–1.00)
GFR calc Af Amer: >60 mL/min (ref >60)
GFR calc non Af Amer: >60 mL/min (ref >60)
Glucose, Bld: 108 mg/dL — ABNORMAL HIGH (ref 70–99)
Potassium: 3.7 mmol/L (ref 3.5–5.1)
Sodium: 143 mmol/L (ref 135–145)

## 2020-01-24 NOTE — Progress Notes (Signed)
Chief Complaint: Patient was seen today for Follow up left LE venogram with CIV stent angioplasty   Supervising Physician: Markus Daft  Patient Status: [facility] - In-pt  Subjective: S/p LLE and pelvic venogram with successful stent assisted angioplasty of (L)CIV She feels a little better this am. Some pains at venous access site.  Objective: Physical Exam: BP 98/65 (BP Location: Right Arm)   Pulse 90   Temp 98 F (36.7 C) (Oral)   Resp 18   Ht _0  (1.397 m)   Wt 60 kg   SpO2 97%   BMI 30.74 kg/m  (L)LE edema actually a little less today compared to yesterday. LSV access site soft, NT, no hematoma Still has brawny chronic skin changes, but improved, less/minimal weeping.   Current Facility-Administered Medications:  .  acetaminophen (TYLENOL) tablet 650 mg, 650 mg, Oral, Q6H PRN, 650 mg at 01/24/20 0449 **OR** acetaminophen (TYLENOL) suppository 650 mg, 650 mg, Rectal, Q6H PRN, Asencion Noble, MD .  apixaban (ELIQUIS) tablet 10 mg, 10 mg, Oral, BID, 10 mg at 01/24/20 0950 **FOLLOWED BY** [START ON 01/30/2020] apixaban (ELIQUIS) tablet 5 mg, 5 mg, Oral, BID, Narendra, Nischal, MD .  cephALEXin (KEFLEX) capsule 500 mg, 500 mg, Oral, Q6H, Madalyn Rob, MD, 500 mg at 01/24/20 0450 .  iohexol (OMNIPAQUE) 300 MG/ML solution 100 mL, 100 mL, Intravenous, Once PRN, Sandi Mariscal, MD .  oxyCODONE (Oxy IR/ROXICODONE) immediate release tablet 5 mg, 5 mg, Oral, Q6H PRN, Madalyn Rob, MD, 5 mg at 01/23/20 1529 .  senna (SENOKOT) tablet 8.6 mg, 1 tablet, Oral, Daily, Madalyn Rob, MD, 8.6 mg at 01/24/20 0949  Labs: CBC Recent Labs    01/23/20 0652 01/24/20 0428  WBC 7.3 7.3  HGB 13.1 13.3  HCT 41.3 41.6  PLT 241 242   BMET Recent Labs    01/22/20 0746 01/24/20 0428  NA 140 143  K 3.5 3.7  CL 109 105  CO2 20* 26  GLUCOSE 131* 108*  BUN 9 5*  CREATININE 0.66 0.54  CALCIUM 8.0* 8.8*   LFT Recent Labs    01/21/20 1126  PROT 6.7  ALBUMIN 3.1*  AST 30  ALT 29    ALKPHOS 71  BILITOT 1.0   PT/INR No results for input(s): LABPROT, INR in the last 72 hours.   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 As such, patient presents today for left lower extremity venogram potential intervention. EXAM: 1. ULTRASOUND GUIDANCE FOR LEFT POPLITEAL VEIN ACCESS. 2. FLUOROSCOPIC GUIDED LEFT LOWER EXTREMITY VENOGRAM AND INFERIOR VENA CAVAGRAM. 3. FLUOROSCOPIC GUIDED LEFT COMMON AND EXTERNAL ILIAC VEIN ANGIOPLASTY AND OVERLAPPING STENT PLACEMENT COMPARISON:  CTV runoff - 01/21/2020 MEDICATIONS: None CONTRAST:  62m OMNIPAQUE IOHEXOL 300 MG/ML  SOLN ANESTHESIA/SEDATION: Moderate (conscious) sedation was employed during this procedure. A total of Versed 3 mg and Fentanyl 200 mcg was administered intravenously. Moderate Sedation Time: 72 minutes. The patient's level of consciousness and vital signs were monitored continuously by radiology nursing throughout the procedure under my direct supervision. FLUOROSCOPY TIME:  8 minutes, 12 seconds (2736mGy) COMPLICATIONS: None immediate. TECHNIQUE: Informed written consent was obtained from the patient (via the use of a medical translator and assisted by the patient's son) after a discussion of the risks, benefits and alternatives to treatment. Questions regarding the procedure were encouraged and answered. A  timeout was performed prior to the initiation of the procedure. The patient was initially positioned supine on the fluoroscopy table and the left groin and medial aspect of the left thigh was prepped and draped in usual sterile fashion. Efforts were made to cannulate both diminutive divisions of the left greater saphenous vein however this ultimately proved unsuccessful given the extremely small size of  these vessels. As such the decision was made to perform the venogram and potential intervention via left lesser saphenous vein approach. The patient was then positioned prone on the fluoroscopy table and the skin posterior to the left knee was prepped and draped in the usual sterile fashion, and a sterile drape was applied covering the operative field. Maximum barrier sterile technique with sterile gowns and gloves were used for the procedure. Under direct ultrasound guidance, the left lesser saphenous vein was access with a micropuncture kit after the overlying soft tissues were anesthetized with 1% lidocaine. An ultrasound image was saved for documentation purposed. Initially, venogram images were performed through the outer sheath of the micropuncture kit. Next, over a Bentson wire, the micropuncture sheath was exchanged for a 10-French vascular sheath. Next, a Kumpe catheter was advanced to the level of the central aspect of the left superficial femoral vein and a left lower extremity approach pelvic venogram was performed. Kumpe catheter was utilized to manipulate an exchange length Rosen wire through the long segment severe (greater 75%) luminal narrowing of the left common femoral vein, external iliac and peripheral aspect of the left common iliac vein to the level of the IVC. Balloon angioplasty was performed at multiple stations throughout the left common and external iliac veins as well as the left common femoral vein with a 10 mm x 4 cm Conquest balloon. Post angioplasty venogram images were obtained. Next, a 16 x 80 mm Venovo venous stent was deployed across the left common and external iliac vein, and subsequently balloon angioplastied at multiple stations with a 16 mm x 4 cm Conquest balloon. Post stent deployment venogram images were obtained. A residual moderate (approximate 50%) luminal narrowing involving the left common femoral vein was angioplastied at multiple stations with a 14 mm x 4 cm  Conquest balloon. Post angioplasty venogram images were obtained. Next, a 14 mm x 60 mm Vici venous stent was deployed overlapping the mid/peripheral aspect of the previous stent extending to the mid aspect of the left common femoral vein. The stent was balloon angioplastied at multiple stations with the 14 mm Conquest balloon. Completion venogram images were obtained and the procedure was terminated. All wires, catheters and sheaths were removed from the patient. Superficial hemostasis was achieved at the access site with manual compression. A dressing was applied. The patient tolerated the procedure well without immediate postprocedural complication. FINDINGS: Left lower extremity venogram demonstrates wide patency of the left superficial femoral vein. 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 These findings are associated with development of several hypertrophied pelvic venous collaterals which provide accessory drainage into ipsilateral left internal iliac and contralateral right external iliac veins. Following prolonged balloon angioplasty to 10 mm diameter, venogram images demonstrates improved flow through the left external iliac vein with persistent filling of hypertrophied venous collaterals. After 16 mm venous stent deployment and angioplasty, there is improved preferential throw through the left external  and common iliac veins. A moderate length moderate narrowing/chronic DVT of the peripheral most aspect of the left external iliac vein as well as the left common femoral vein was treated with prolonged balloon angioplasty to 14 mm diameter however post angioplasty venogram demonstrates a residual stenosis. As such, an overlapping 14 mm venous stent was  deployed across the residual stenosis and subsequently balloon angioplastied to 14 mm diameter. Completion venogram images demonstrate a technically excellent result with preferential inline flow through the left common femoral, external and common iliac veins with no persistent filling of the hypertrophy deep and trans pelvic venous collaterals. 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 PLAN: - The patient will be given loaded dose of aspirin and discharged on 81 mg of aspirin for the next 3 months to allow for adequate stent epithelialization. - The patient will follow-up in 3-4 weeks with either Drs. Henn or Wagner for formal venous evaluation as well as potential lesser saphenous ablation. Electronically Signed   By: Sandi Mariscal M.D.   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 As such, patient presents today for left lower extremity venogram potential intervention. EXAM: 1. ULTRASOUND GUIDANCE FOR LEFT POPLITEAL VEIN ACCESS. 2. FLUOROSCOPIC GUIDED LEFT LOWER EXTREMITY VENOGRAM AND INFERIOR VENA CAVAGRAM. 3. FLUOROSCOPIC GUIDED LEFT COMMON AND EXTERNAL ILIAC VEIN ANGIOPLASTY AND OVERLAPPING STENT PLACEMENT COMPARISON:  CTV runoff - 01/21/2020 MEDICATIONS: None CONTRAST:  95m OMNIPAQUE IOHEXOL 300 MG/ML  SOLN ANESTHESIA/SEDATION: Moderate (conscious) sedation was employed during this procedure. A total of Versed 3 mg and Fentanyl 200 mcg was administered intravenously. Moderate Sedation  Time: 72 minutes. The patient's level of consciousness and vital signs were monitored continuously by radiology nursing throughout the procedure under my direct supervision. FLUOROSCOPY TIME:  8 minutes, 12 seconds (2381mGy) COMPLICATIONS: None immediate. TECHNIQUE: Informed written consent was obtained from the patient (via the use of a medical translator and assisted by the patient's son) after a discussion of the risks, benefits and alternatives to treatment. Questions regarding the procedure were encouraged and answered. A timeout was performed prior to the initiation of the procedure. The patient was initially positioned supine on the fluoroscopy table and the left groin and medial aspect of the left thigh was prepped and draped in usual sterile fashion. Efforts were made to cannulate both diminutive divisions of the left greater saphenous vein however this ultimately proved unsuccessful given the extremely small size of these vessels. As such the decision was made to perform the venogram and potential intervention via left lesser saphenous vein approach. The patient was then positioned prone on the fluoroscopy table and the skin posterior to the left knee was prepped and draped in the usual sterile fashion, and a sterile drape was applied covering the operative field. Maximum barrier sterile technique with sterile gowns and gloves were used for the procedure. Under direct ultrasound guidance, the left lesser saphenous vein was access with a micropuncture kit after the overlying soft tissues were anesthetized with 1% lidocaine. An ultrasound image was saved for documentation purposed. Initially, venogram images were performed through  the outer sheath of the micropuncture kit. Next, over a Bentson wire, the micropuncture sheath was exchanged for a 10-French vascular sheath. Next, a Kumpe catheter was advanced to the level of the central aspect of the left superficial femoral vein and a left lower extremity  approach pelvic venogram was performed. Kumpe catheter was utilized to manipulate an exchange length Rosen wire through the long segment severe (greater 75%) luminal narrowing of the left common femoral vein, external iliac and peripheral aspect of the left common iliac vein to the level of the IVC. Balloon angioplasty was performed at multiple stations throughout the left common and external iliac veins as well as the left common femoral vein with a 10 mm x 4 cm Conquest balloon. Post angioplasty venogram images were obtained. Next, a 16 x 80 mm Venovo venous stent was deployed across the left common and external iliac vein, and subsequently balloon angioplastied at multiple stations with a 16 mm x 4 cm Conquest balloon. Post stent deployment venogram images were obtained. A residual moderate (approximate 50%) luminal narrowing involving the left common femoral vein was angioplastied at multiple stations with a 14 mm x 4 cm Conquest balloon. Post angioplasty venogram images were obtained. Next, a 14 mm x 60 mm Vici venous stent was deployed overlapping the mid/peripheral aspect of the previous stent extending to the mid aspect of the left common femoral vein. The stent was balloon angioplastied at multiple stations with the 14 mm Conquest balloon. Completion venogram images were obtained and the procedure was terminated. All wires, catheters and sheaths were removed from the patient. Superficial hemostasis was achieved at the access site with manual compression. A dressing was applied. The patient tolerated the procedure well without immediate postprocedural complication. FINDINGS: Left lower extremity venogram demonstrates wide patency of the left superficial femoral vein. 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 These findings are associated with development of several hypertrophied pelvic venous collaterals which provide accessory drainage into ipsilateral left internal iliac and contralateral right external iliac veins. Following prolonged balloon angioplasty to 10 mm diameter, venogram images demonstrates improved flow through the left external iliac vein with persistent filling of hypertrophied venous collaterals. After 16 mm venous stent deployment and angioplasty, there is improved preferential throw through the left external and common iliac veins. A moderate length moderate narrowing/chronic DVT of the peripheral most aspect of the left external iliac vein as well as the left common femoral vein was treated with prolonged balloon angioplasty to 14 mm diameter however post angioplasty venogram demonstrates a residual stenosis. As such, an overlapping 14 mm venous stent was deployed across the residual stenosis and subsequently balloon angioplastied to 14 mm diameter. Completion venogram images demonstrate a technically excellent result with preferential inline flow through the left common femoral, external and common iliac veins with no persistent filling of the hypertrophy deep and trans pelvic venous collaterals. 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 PLAN: - The patient will be given loaded dose of aspirin and discharged on 81 mg of aspirin for the next 3 months to allow  for adequate stent epithelialization. - The patient will follow-up in 3-4 weeks with either Drs. Henn or Wagner for formal venous evaluation as well as potential lesser saphenous ablation. Electronically Signed    By: Sandi Mariscal M.D.   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 As such, patient presents today for left lower extremity venogram potential intervention. EXAM: 1. ULTRASOUND GUIDANCE FOR LEFT POPLITEAL VEIN ACCESS. 2. FLUOROSCOPIC GUIDED LEFT LOWER EXTREMITY VENOGRAM AND INFERIOR VENA CAVAGRAM. 3. FLUOROSCOPIC GUIDED LEFT COMMON AND EXTERNAL ILIAC VEIN ANGIOPLASTY AND OVERLAPPING STENT PLACEMENT COMPARISON:  CTV runoff - 01/21/2020 MEDICATIONS: None CONTRAST:  60m OMNIPAQUE IOHEXOL 300 MG/ML  SOLN ANESTHESIA/SEDATION: Moderate (conscious) sedation was employed during this procedure. A total of Versed 3 mg and Fentanyl 200 mcg was administered intravenously. Moderate Sedation Time: 72 minutes. The patient's level of consciousness and vital signs were monitored continuously by radiology nursing throughout the procedure under my direct supervision. FLUOROSCOPY TIME:  8 minutes, 12 seconds (2008mGy) COMPLICATIONS: None immediate. TECHNIQUE: Informed written consent was obtained from the patient (via the use of a medical translator and assisted by the patient's son) after a discussion of the risks, benefits and alternatives to treatment. Questions regarding the procedure were encouraged and answered. A timeout was performed prior to the initiation of the procedure. The patient was initially positioned supine on the fluoroscopy table and the left groin and medial aspect of the left thigh was prepped and draped in usual sterile fashion. Efforts were made to cannulate both diminutive divisions of the left greater saphenous vein however this ultimately proved unsuccessful given the extremely small size of these vessels. As such the decision was  made to perform the venogram and potential intervention via left lesser saphenous vein approach. The patient was then positioned prone on the fluoroscopy table and the skin posterior to the left knee was prepped and draped in the usual sterile fashion, and a sterile drape was applied covering the operative field. Maximum barrier sterile technique with sterile gowns and gloves were used for the procedure. Under direct ultrasound guidance, the left lesser saphenous vein was access with a micropuncture kit after the overlying soft tissues were anesthetized with 1% lidocaine. An ultrasound image was saved for documentation purposed. Initially, venogram images were performed through the outer sheath of the micropuncture kit. Next, over a Bentson wire, the micropuncture sheath was exchanged for a 10-French vascular sheath. Next, a Kumpe catheter was advanced to the level of the central aspect of the left superficial femoral vein and a left lower extremity approach pelvic venogram was performed. Kumpe catheter was utilized to manipulate an exchange length Rosen wire through the long segment severe (greater 75%) luminal narrowing of the left common femoral vein, external iliac and peripheral aspect of the left common iliac vein to the level of the IVC. Balloon angioplasty was performed at multiple stations throughout the left common and external iliac veins as well as the left common femoral vein with a 10 mm x 4 cm Conquest balloon. Post angioplasty venogram images were obtained. Next, a 16 x 80 mm Venovo venous stent was deployed across the left common and external iliac vein, and subsequently balloon angioplastied at multiple stations with a 16 mm x 4 cm Conquest balloon. Post stent  deployment venogram images were obtained. A residual moderate (approximate 50%) luminal narrowing involving the left common femoral vein was angioplastied at multiple stations with a 14 mm x 4 cm Conquest balloon. Post angioplasty venogram  images were obtained. Next, a 14 mm x 60 mm Vici venous stent was deployed overlapping the mid/peripheral aspect of the previous stent extending to the mid aspect of the left common femoral vein. The stent was balloon angioplastied at multiple stations with the 14 mm Conquest balloon. Completion venogram images were obtained and the procedure was terminated. All wires, catheters and sheaths were removed from the patient. Superficial hemostasis was achieved at the access site with manual compression. A dressing was applied. The patient tolerated the procedure well without immediate postprocedural complication. FINDINGS: Left lower extremity venogram demonstrates wide patency of the left superficial femoral vein. 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 These findings are associated with development of several hypertrophied pelvic venous collaterals which provide accessory drainage into ipsilateral left internal iliac and contralateral right external iliac veins. Following prolonged balloon angioplasty to 10 mm diameter, venogram images demonstrates improved flow through the left external iliac vein with persistent filling of hypertrophied venous collaterals. After 16 mm venous stent deployment and angioplasty, there is improved preferential throw through the left external and common iliac veins. A moderate length moderate narrowing/chronic DVT of the peripheral most aspect of the left external iliac vein as well as the left common femoral vein was treated with prolonged balloon angioplasty to 14 mm diameter however post angioplasty venogram demonstrates a residual stenosis. As such, an overlapping 14 mm venous stent was deployed across the residual stenosis and  subsequently balloon angioplastied to 14 mm diameter. Completion venogram images demonstrate a technically excellent result with preferential inline flow through the left common femoral, external and common iliac veins with no persistent filling of the hypertrophy deep and trans pelvic venous collaterals. 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 PLAN: - The patient will be given loaded dose of aspirin and discharged on 81 mg of aspirin for the next 3 months to allow for adequate stent epithelialization. - The patient will follow-up in 3-4 weeks with either Drs. Henn or Wagner for formal venous evaluation as well as potential lesser saphenous ablation. Electronically Signed   By: Sandi Mariscal M.D.   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 Pt to be discharged on po Eliquis and ASA 48m  We will contact her or her son to set up follow up in 3-4 weeks.    LOS: 3 days   I spent a total of 15 minutes in face to face in clinical consultation, greater than 50% of which was counseling/coordinating care for LLE venogram with stent angioplasty  KAscencion DikePA-C 01/24/2020 10:28 AM

## 2020-01-24 NOTE — Discharge Summary (Signed)
Name: Olivia Rocha MRN: [id number] DOB: [birth year] 54 y.o. PCP: Patient, No Pcp Per  Date of Admission: 01/21/2020 11:12 AM Date of Discharge: 01/24/2020 Attending Physician: Aldine Contes  Discharge Diagnosis: 1.  Acute left femoral DVT 2. left lower extremity cellulitis 3. chronic venous stasis from left iliac vein stenosis, s/p overlapping venous stent  Discharge Medications: Allergies as of 01/24/2020   No Known Allergies     Medication List    TAKE these medications   acetaminophen 500 MG tablet Commonly known as: TYLENOL Take 2 tablets (1,000 mg total) by mouth every 8 (eight) hours as needed.   apixaban 5 MG Tabs tablet Commonly known as: Eliquis Take 2 tablets (50m) twice daily for 7 days, then 1 tablet (575m twice daily   aspirin EC 81 MG tablet Take 1 tablet (81 mg total) by mouth daily for 90 doses.   cephALEXin 500 MG capsule Commonly known as: KEFLEX Take 1 capsule (500 mg total) by mouth every 6 (six) hours for 3 days.   ibuprofen 600 MG tablet Commonly known as: ADVIL Take 1 tablet (600 mg total) by mouth every 6 (six) hours as needed. What changed: Another medication with the same name was removed. Continue taking this medication, and follow the directions you see here.   oxyCODONE 5 MG immediate release tablet Commonly known as: Oxy IR/ROXICODONE Take 1 tablet (5 mg total) by mouth every 6 (six) hours as needed for up to 3 days for severe pain.   senna 8.6 MG Tabs tablet Commonly known as: SENOKOT Take 1 tablet (8.6 mg total) by mouth daily for 4 days. Take to help keep bowel movement frequency normal while on oxycodone.       Disposition and follow-up:   [facility]  At the hospital follow up visit please address:  1.  #Provoked DVT, likely 2/2 to chronic venous stasis          Patient started on apixaban on discharge, transfer of care pharmacy  provided $10/month co-pay               card for patient she will need to activate. She was given first month free and provided a free 30-day           card also.       - Discuss medication compliance and any barriers            #Chronic venous stasis           Narrowed left external iliac vein status post overlapping venous stent deployment.           Patient should be on Asprin 81 mg daily for next 3 months and scheduled for follow up in          IR outpatient clinic in 3-4 weeks with either Drs. Henn or Wagner for formal venous evaluation as              well as potential lesser saphenous ablation.            #Cellulitis        - Assess left lower extremity for resolution of cellulitis status post Keflex for 5 days        - 3 day supply of oxycodone for severe pain, should not need to refill with resolution of cellulits  2.  Labs / imaging needed at time of follow-up: na  3.  Pending labs/ test needing follow-up: na  Follow-up Appointments: Follow-up Information    [place]. Go to.   Why: Feb 11th at 10:45. Please arrive 15-30 minutes early to fill out paperwork. If this does not work please contact us to change the time/date.  Contact information: [address] [place], [place], DO. Go in 4 week(s).   Specialties: Interventional Radiology, Radiology Why: Office will call you to schedule follow up. Contact information: [place] [address] [place] [address] [phone]        [facility] Billing Department. Call.   Why: Call for assistance with hospital billing.  Contact information: [place] by problem list: 1. #DVT  #Chronic venous stasis  First DVT patient is aware of. Provoked , Likely 2/2 to chronic venous stasis. 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  CT Venogram  demonstrates abnormally small left external iliac vein. Discussed with radiology and consulted IR. Now s/p overlapping stent placement and angioplasty on 01/23/2020 with Dr.Watts. Patient  Patient initially on Heparin, stopped after procedure, started on Apixiban with plan to continue at least for 6 months. Received 325 mg Asprin after stent placement, plan to continue 81 mg Asprin for 3 months. TOC and TOC pharmacy's consulted for assistance with DOAC. Patient recently unemployed from [place] , reports 2/2 to not being able to walk because of venous stasis.  #Cellulitis Cellulitis appears non purulent. Improving with antibiotics.  - Keflex 500 mg 4 times dailey for 5 days , stop date 01/26/2020, pain controlled with tylenol and PRN 65m oxycodone. Sent out with 3 day supply of oxycodone.  Discharge Vitals:   BP 112/74 (BP Location: Right Arm)   Pulse 83   Temp 98 F (36.7 C) (Oral)   Resp 18   Ht _0  (1.397 m)   Wt 60 kg   SpO2 96%   BMI 30.74 kg/m   Pertinent Labs, Studies, and Procedures:  BMP Latest Ref Rng & Units 01/24/2020 01/22/2020 01/21/2020  Glucose 70 - 99 mg/dL 108(H) 131(H) 155(H)  BUN 6 - 20 mg/dL 5(L) 9 7  Creatinine 0.44 - 1.00 mg/dL 0.54 0.66 0.62  BUN/Creat Ratio 9 - 23 - - -  Sodium 135 - 145 mmol/L 143 140 134(L)  Potassium 3.5 - 5.1 mmol/L 3.7 3.5 4.4  Chloride 98 - 111 mmol/L 105 109 102  CO2 22 - 32 mmol/L 26 20(L) 22  Calcium 8.9 - 10.3 mg/dL 8.8(L) 8.0(L) 8.6(L)   CBC Latest Ref Rng & Units 01/24/2020 01/23/2020 01/22/2020  WBC 4.0 - 10.5 K/uL 7.3 7.3 8.6  Hemoglobin 12.0 - 15.0 g/dL 13.3 13.1 13.6  Hematocrit 36.0 - 46.0 % 41.6 41.3 42.3  Platelets 150 - 400 K/uL 242 241 244   CLINICAL DATA:  Abnormal lower extremity duplex  EXAM: CT VENOGRAM OF THE ABDOMEN AND PELVIS AS WELL AS THE LOWER EXTREMITIES BILATERALLY.  TECHNIQUE: Multidetector CT imaging of the abdomen and pelvis and bilateral lower extremitieswas performed using the standard protocol  during bolus administration of intravenous contrast. Multiplanar CT image reconstructions and MIPs were obtained to evaluate the vascular anatomy.  CONTRAST:  1079mOMNIPAQUE IOHEXOL 350 MG/ML SOLN  COMPARISON:  By report from prior lower extremity venous duplex from earlier  in the same day.  FINDINGS: Vascular: The abdominal aorta and inferior vena cava are well visualized and within normal limits. Normal branching pattern of the abdominal aorta is seen. No aneurysmal dilatation is noted.  The iliac veins are well visualized and within normal limits. There is a filling defect noted within the left common femoral vein consistent with the given clinical history of common femoral deep venous thrombosis. It is nonocclusive and occupies approximately 50% of the vein lumen posteriorly.  The left superficial femoral and popliteal veins are widely patent. The infrapopliteal veins demonstrate evidence of thrombus within the peroneal veins just below the confluence of the posterior tibial and peroneal veins into the tibioperoneal trunk. This is best visualized on image number 163 of series 6. The more peripheral deep venous structures appear within normal limits. The lesser saphenous vein is enlarged and there are multiple varicosities identified involving the greater saphenous vein in the calf. No other definitive deep venous thrombosis is noted.  The right lower extremity as visualized shows no deep venous thrombosis.  The lower extremity arterial structures show no significant stenosis as visualized.  Nonvascular:  Hepatobiliary: The liver is diffusely fatty infiltrated. The gallbladder is within normal limits.  Pancreas: Within normal limits.  Spleen: Within normal limits.  Adrenal/kidneys: Adrenal glands are within normal limits. Kidneys demonstrate a normal enhancement pattern with normal excretion bilaterally. The bladder is well distended.  Bowel: No  obstructive or inflammatory changes of the bowel are seen. The appendix is not well visualized although no inflammatory changes are noted. The small bowel and stomach appear within normal limits.  Lymphatic: Scattered small retroperitoneal lymph nodes are seen. None of these are significant by size criteria. Some small iliac chain nodes are seen with slightly more prominent inguinal lymph nodes identified on the left. These are likely reactive given the degree of peripheral inflammatory change in the left calf.  Reproductive: The uterus and adnexa appear within normal limits.  Musculoskeletal: Degenerative changes of the lumbar spine are seen. Old compression deformities are noted within the lumbar spine.  Review of the MIP images confirms the above findings.  IMPRESSION: VASCULAR: Thrombus is noted within the left common femoral vein as well as in the peroneal veins as described. These are nonocclusive in nature.  Prominent lesser saphenous vein on the left with evidence of varicosities in the left calf.  NONVASCULAR: Fatty infiltration of the liver.  Scattered prominent lymph nodes are noted within the left inguinal region likely reactive in nature given the degree of peripheral inflammatory change. No sizable retroperitoneal lymph nodes are noted.  Chronic appearing compression deformities in the lumbar spine.   IR Veno/Ext/Uni left INDICATION: Chronic left lower extremity swelling and venous insufficiency with preceding runoff CTV demonstrating findings worrisome for chronic narrowing/subtotal occlusion of the left common and external iliac veins.  As such, patient presents today for left lower extremity venogram potential intervention.  EXAM: 1. ULTRASOUND GUIDANCE FOR LEFT POPLITEAL VEIN ACCESS. 2. FLUOROSCOPIC GUIDED LEFT LOWER EXTREMITY VENOGRAM AND INFERIOR VENA CAVAGRAM. 3. FLUOROSCOPIC GUIDED LEFT COMMON AND EXTERNAL ILIAC VEIN ANGIOPLASTY AND  OVERLAPPING STENT PLACEMENT  COMPARISON:  CTV runoff - 01/21/2020  MEDICATIONS: None  CONTRAST:  64m OMNIPAQUE IOHEXOL 300 MG/ML  SOLN  ANESTHESIA/SEDATION: Moderate (conscious) sedation was employed during this procedure. A total of Versed 3 mg and Fentanyl 200 mcg was administered intravenously.  Moderate Sedation Time: 72 minutes. The patient's level of consciousness and vital signs were monitored continuously by radiology nursing throughout the procedure  under my direct supervision.  FLUOROSCOPY TIME:  8 minutes, 12 seconds (748 mGy)  COMPLICATIONS: None immediate.  TECHNIQUE: Informed written consent was obtained from the patient (via the use of a medical translator and assisted by the patient's son) after a discussion of the risks, benefits and alternatives to treatment. Questions regarding the procedure were encouraged and answered. A timeout was performed prior to the initiation of the procedure.  The patient was initially positioned supine on the fluoroscopy table and the left groin and medial aspect of the left thigh was prepped and draped in usual sterile fashion. Efforts were made to cannulate both diminutive divisions of the left greater saphenous vein however this ultimately proved unsuccessful given the extremely small size of these vessels. As such the decision was made to perform the venogram and potential intervention via left lesser saphenous vein approach.  The patient was then positioned prone on the fluoroscopy table and the skin posterior to the left knee was prepped and draped in the usual sterile fashion, and a sterile drape was applied covering the operative field. Maximum barrier sterile technique with sterile gowns and gloves were used for the procedure.  Under direct ultrasound guidance, the left lesser saphenous vein was access with a micropuncture kit after the overlying soft tissues were anesthetized with 1% lidocaine. An  ultrasound image was saved for documentation purposed. Initially, venogram images were performed through the outer sheath of the micropuncture kit. Next, over a Bentson wire, the micropuncture sheath was exchanged for a 10-French vascular sheath.  Next, a Kumpe catheter was advanced to the level of the central aspect of the left superficial femoral vein and a left lower extremity approach pelvic venogram was performed.  Kumpe catheter was utilized to manipulate an exchange length Rosen wire through the long segment severe (greater 75%) luminal narrowing of the left common femoral vein, external iliac and peripheral aspect of the left common iliac vein to the level of the IVC.  Balloon angioplasty was performed at multiple stations throughout the left common and external iliac veins as well as the left common femoral vein with a 10 mm x 4 cm Conquest balloon. Post angioplasty venogram images were obtained.  Next, a 16 x 80 mm Venovo venous stent was deployed across the left common and external iliac vein, and subsequently balloon angioplastied at multiple stations with a 16 mm x 4 cm Conquest balloon. Post stent deployment venogram images were obtained.  A residual moderate (approximate 50%) luminal narrowing involving the left common femoral vein was angioplastied at multiple stations with a 14 mm x 4 cm Conquest balloon. Post angioplasty venogram images were obtained.  Next, a 14 mm x 60 mm Vici venous stent was deployed overlapping the mid/peripheral aspect of the previous stent extending to the mid aspect of the left common femoral vein. The stent was balloon angioplastied at multiple stations with the 14 mm Conquest balloon.  Completion venogram images were obtained and the procedure was terminated.  All wires, catheters and sheaths were removed from the patient. Superficial hemostasis was achieved at the access site with manual compression. A dressing was applied.  The patient tolerated the procedure well without immediate postprocedural complication.  FINDINGS: Left lower extremity venogram demonstrates wide patency of the left superficial femoral vein.  There is long segment, severe (greater 70%) luminal narrowing involving the peripheral aspect of the left common iliac vein extending throughout the entirety of the left external iliac vein with moderate (approximately 2%) luminal narrowing/chronic DVT involving the  left common femoral vein. These findings are associated with development of several hypertrophied pelvic venous collaterals which provide accessory drainage into ipsilateral left internal iliac and contralateral right external iliac veins.  Following prolonged balloon angioplasty to 10 mm diameter, venogram images demonstrates improved flow through the left external iliac vein with persistent filling of hypertrophied venous collaterals. After 16 mm venous stent deployment and angioplasty, there is improved preferential throw through the left external and common iliac veins.  A moderate length moderate narrowing/chronic DVT of the peripheral most aspect of the left external iliac vein as well as the left common femoral vein was treated with prolonged balloon angioplasty to 14 mm diameter however post angioplasty venogram demonstrates a residual stenosis. As such, an overlapping 14 mm venous stent was deployed across the residual stenosis and subsequently balloon angioplastied to 14 mm diameter.  Completion venogram images demonstrate a technically excellent result with preferential inline flow through the left common femoral, external and common iliac veins with no persistent filling of the hypertrophy deep and trans pelvic venous collaterals.  IMPRESSION: Successful fluoroscopic guided treatment of long segment hemodynamically significant narrowing involving the peripheral aspect of the left common iliac vein, the  entirety of the left external iliac vein as well as the central aspect of the left common femoral vein with overlapping venous stent deployment.  PLAN: - The patient will be given loaded dose of aspirin and discharged on 81 mg of aspirin for the next 3 months to allow for adequate stent epithelialization.  - The patient will follow-up in 3-4 weeks with either Drs. Henn or Wagner for formal venous evaluation as well as potential lesser saphenous ablation.  Discharge Instructions: Discharge Instructions    Diet - low sodium heart healthy   Complete by: As directed    Diet - low sodium heart healthy   Complete by: As directed    Increase activity slowly   Complete by: As directed    Increase activity slowly   Complete by: As directed       Signed: Madalyn Rob, MD 01/25/2020, 11:34 AM

## 2020-01-24 NOTE — Evaluation (Signed)
Physical Therapy Evaluation Patient Details Name: Olivia Rocha MRN: [id number] DOB: 1965/12/25 Today's Date: 01/24/2020   History of Present Illness  Pt is a 54 y/o female admitted secondary to LLE pain. Found to have DVT and is s/p LLE venogram with CIV stent angioplasty. PMH includes prediabetes.   Clinical Impression  Pt admitted secondary to problem above with deficits below. Pt's son present during session and assisted with interpreting as ipad interpreter was not correct dialect. Pt requiring min guard A for mobility using RW. Educated about using RW for mobility and 3 in1 for shower seat. Pt's son voiced understanding. Will continue to follow acutely to maximize functional mobility independence and safety.     Follow Up Recommendations No PT follow up    Equipment Recommendations  Rolling walker with 5" wheels;3in1 (PT)(youth RW )    Recommendations for Other Services       Precautions / Restrictions Precautions Precautions: Fall Restrictions Weight Bearing Restrictions: No      Mobility  Bed Mobility Overal bed mobility: Needs Assistance Bed Mobility: Sit to Supine       Sit to supine: Supervision   General bed mobility comments: Supervision for safety.   Transfers Overall transfer level: Needs assistance Equipment used: None Transfers: Sit to/from Stand Sit to Stand: Supervision         General transfer comment: Supervision for safety. Pt holding onto bed rail, so gave RW once standing.   Ambulation/Gait Ambulation/Gait assistance: Min guard Gait Distance (Feet): 75 Feet Assistive device: Rolling walker (2 wheeled) Gait Pattern/deviations: Step-to pattern;Decreased step length - right;Decreased step length - left;Decreased weight shift to left;Antalgic Gait velocity: Decreased   General Gait Details: Slow, antalgic gait. Overall pt tolerated well. Educated pt and pt's son about using RW at home.   Stairs            Wheelchair  Mobility    Modified Rankin (Stroke Patients Only)       Balance Overall balance assessment: Needs assistance Sitting-balance support: No upper extremity supported;Feet supported Sitting balance-Leahy Scale: Fair     Standing balance support: Bilateral upper extremity supported;During functional activity Standing balance-Leahy Scale: Poor Standing balance comment: Reliant on BUE support                              Pertinent Vitals/Pain Pain Assessment: Faces Faces Pain Scale: Hurts a little bit Pain Location: LLE Pain Descriptors / Indicators: Grimacing;Guarding Pain Intervention(s): Limited activity within patient's tolerance;Monitored during session;Repositioned    Home Living Family/patient expects to be discharged to:: Private residence Living Arrangements: Children Available Help at Discharge: Family Type of Home: Apartment Home Access: Level entry     Home Layout: One level Home Equipment: None      Prior Function Level of Independence: Independent               Hand Dominance        Extremity/Trunk Assessment   Upper Extremity Assessment Upper Extremity Assessment: Overall WFL for tasks assessed    Lower Extremity Assessment Lower Extremity Assessment: LLE deficits/detail LLE Deficits / Details: Increased guarding in LLE secondary to pain    Cervical / Trunk Assessment Cervical / Trunk Assessment: Normal  Communication   Communication: Prefers language other than English;Other (comment)(son used to interpret as we do not have dialect with ipad )  Cognition Arousal/Alertness: Awake/alert Behavior During Therapy: WFL for tasks assessed/performed Overall Cognitive Status: Within Functional Limits for tasks  assessed                                        General Comments General comments (skin integrity, edema, etc.): Pt's son present during session. Educated about using 3 in 1 as shower seat to increase safety  with bathing.     Exercises     Assessment/Plan    PT Assessment Patient needs continued PT services  PT Problem List Decreased balance;Decreased activity tolerance;Decreased mobility;Decreased knowledge of use of DME;Decreased knowledge of precautions       PT Treatment Interventions Gait training;DME instruction;Functional mobility training;Therapeutic activities;Therapeutic exercise;Balance training;Patient/family education    PT Goals (Current goals can be found in the Care Plan section)  Acute Rehab PT Goals Patient Stated Goal: to go home PT Goal Formulation: With patient Time For Goal Achievement: 02/07/20 Potential to Achieve Goals: Good    Frequency Min 3X/week   Barriers to discharge        Co-evaluation               AM-PAC PT "6 Clicks" Mobility  Outcome Measure Help needed turning from your back to your side while in a flat bed without using bedrails?: None Help needed moving from lying on your back to sitting on the side of a flat bed without using bedrails?: A Little Help needed moving to and from a bed to a chair (including a wheelchair)?: A Little Help needed standing up from a chair using your arms (e.g., wheelchair or bedside chair)?: A Little Help needed to walk in hospital room?: A Little Help needed climbing 3-5 steps with a railing? : A Lot 6 Click Score: 18    End of Session   Activity Tolerance: Patient tolerated treatment well Patient left: in bed;with call bell/phone within reach;with family/visitor present Nurse Communication: Mobility status PT Visit Diagnosis: Other abnormalities of gait and mobility (R26.89);Pain Pain - Right/Left: Left Pain - part of body: Leg    Time: 4431-5400 PT Time Calculation (min) (ACUTE ONLY): 15 min   Charges:   PT Evaluation $PT Eval Moderate Complexity: 1 Mod          Reuel Derby, PT, DPT  Acute Rehabilitation Services  Pager: [phone] Office: [phone]   Rudean Hitt 01/24/2020, 2:34 PM

## 2020-01-24 NOTE — Progress Notes (Signed)
   Subjective:   Interpretor used via ipad. Patient has noticed improvement in her leg. Having some sharp pain in her left leg which comes and goes. We discussed following up with [facility] and IR Clinic. Also discussed the new medications and patient aware this will be on her discharge. She has recently been unemployed and ask for SW to come by to discuss any assistance she could be offered.   Objective:  Vital signs in last 24 hours: Vitals:   01/23/20 1315 01/23/20 1330 01/23/20 1947 01/24/20 0346  BP: 137/80 130/82 103/66 110/72  Pulse: 88 75 77 78  Resp: (!) 21 16 18 18   Temp:  98.4 F (36.9 C) 99 F (37.2 C) 98.3 F (36.8 C)  TempSrc:  Oral Oral Oral  SpO2: 99% 95% 97% 97%  Weight:      Height:       General: NAD, resting comfortably  HEENT:[place]/AT , sclera antiicteric  Cardiovascular: RRR, no MRG Abdominal: non- tender , bowel sounds present   Musculoskeletal: Left lower extremity edema much improved, erythema much improved  Skin: Dry, scaly left lower extremity   CBC Latest Ref Rng & Units 01/24/2020 01/23/2020 01/22/2020  WBC 4.0 - 10.5 K/uL 7.3 7.3 8.6  Hemoglobin 12.0 - 15.0 g/dL 03/21/2020 66.5 99.3  Hematocrit 36.0 - 46.0 % 41.6 41.3 42.3  Platelets 150 - 400 K/uL 242 241 244   BMP Latest Ref Rng & Units 01/24/2020 01/22/2020 01/21/2020  Glucose 70 - 99 mg/dL 03/20/2020) 177(L) 390(Z)  BUN 6 - 20 mg/dL 5(L) 9 7  Creatinine 009(Q - 1.00 mg/dL 3.30 0.76 2.26  BUN/Creat Ratio 9 - 23 - - -  Sodium 135 - 145 mmol/L 143 140 134(L)  Potassium 3.5 - 5.1 mmol/L 3.7 3.5 4.4  Chloride 98 - 111 mmol/L 105 109 102  CO2 22 - 32 mmol/L 26 20(L) 22  Calcium 8.9 - 10.3 mg/dL 3.33) 5.4(T) 6.2(B)     Assessment/Plan:  Active Problems:   DVT (deep venous thrombosis) (HCC)  Kenetra 6.3(S is a 54 year old female without significant past medical history who presents with left lower extremity DVT.   #DVT  #Chronic venous stasis  First DVT patient is aware of. Provoked , Likely 2/2 to chronic  venous stasis. Now s/p overlapping stent placement and angioplasty on 01/23/2020 with Dr.Watts. - Patient started on Apixiban yesterday for DVT. - Received 325 mg Asprin yesterday after stent placement. - Appreciate TOC and TOC pharmacy's assistance with DOAC P: - 81 mg asprin daily for the next 3 months - Follow up with IR Clinic in 3 -4 weeks - Continue Eliquis for 6 months, follow up with [facility] - Consult SW for financial assistance programs  #Cellulitis Cellulitis appears non purulent. Improving with antibiotics.  - Keflex 500 mg 4 times dailey for 5 days , stop date 01/26/2020 - PRN tylenol for moderate pain -PRN oxycodone 5 mg for severe pain   Prior to Admission Living Arrangement:home Anticipated Discharge Location:home Barriers to Discharge: Clinical improvement Dispo: Anticipated discharge today.  03/25/2020, MD PGY1

## 2020-01-24 NOTE — Plan of Care (Signed)
Pt's son at the bedside and all D/C instructions were given with verbal understanding. Medications were given to Pt and son by [facility] and the medications were reviewed with the son. Pt's IV was removed prior to D/C. Pt received RW and 3-n-1 per MD order prior to D/C. Pt D/C'd home via wheelchair per MD order. Pt is stable @ D/C and has no other needs at this time. Rema Fendt, RN

## 2020-01-31 ENCOUNTER — Other Ambulatory Visit: Payer: Self-pay | Admitting: Diagnostic Radiology

## 2020-01-31 ENCOUNTER — Encounter: Payer: Self-pay | Admitting: Internal Medicine

## 2020-01-31 ENCOUNTER — Ambulatory Visit: Payer: BC Managed Care – PPO | Admitting: Internal Medicine

## 2020-01-31 VITALS — BP 109/69 | HR 74 | Temp 98.3°F | Ht <= 58 in | Wt 124.1 lb

## 2020-01-31 DIAGNOSIS — Z7901 Long term (current) use of anticoagulants: Secondary | ICD-10-CM

## 2020-01-31 DIAGNOSIS — Z95820 Peripheral vascular angioplasty status with implants and grafts: Secondary | ICD-10-CM | POA: Diagnosis not present

## 2020-01-31 DIAGNOSIS — I824Y2 Acute embolism and thrombosis of unspecified deep veins of left proximal lower extremity: Secondary | ICD-10-CM

## 2020-01-31 DIAGNOSIS — Z86718 Personal history of other venous thrombosis and embolism: Secondary | ICD-10-CM | POA: Diagnosis not present

## 2020-01-31 DIAGNOSIS — Z7982 Long term (current) use of aspirin: Secondary | ICD-10-CM

## 2020-01-31 DIAGNOSIS — I771 Stricture of artery: Secondary | ICD-10-CM | POA: Insufficient documentation

## 2020-01-31 DIAGNOSIS — I871 Compression of vein: Secondary | ICD-10-CM | POA: Insufficient documentation

## 2020-01-31 DIAGNOSIS — L03116 Cellulitis of left lower limb: Secondary | ICD-10-CM | POA: Diagnosis not present

## 2020-01-31 DIAGNOSIS — I83892 Varicose veins of left lower extremities with other complications: Secondary | ICD-10-CM

## 2020-01-31 DIAGNOSIS — I82402 Acute embolism and thrombosis of unspecified deep veins of left lower extremity: Secondary | ICD-10-CM

## 2020-01-31 NOTE — Patient Instructions (Addendum)
This is how you are supposed to take your medications:   Eliquis (this is the blood thinner) - 1 tablet twice a day   Aspirin - 1 tablet once a day   Please do not take more than what was prescribed as this can make you bleed.   When you get home, call us and let us know what pharmacy you would like to use. We need this information to be able to send you refills for the blood thinner because you need to be on it for 6 months.   You need a follow up appointment with the doctors that put the stent in your left leg. They will call you to schedule this appointment. If they do not call you, please call the number I circled in purple in your paperwork.   Please also schedule a follow up appointment with [place] in 4 week. We want to see how you are doing and make sure you are taking the medications the right way.   - Dr. Evelene Croon

## 2020-01-31 NOTE — Progress Notes (Signed)
   CC: Establish care, LLE DVT, cellulitis   HPI:  Olivia Rocha is a 54 y.o. year-old female with PMH listed below who presents to clinic for establish care, LLE DVT, cellulitis. Please see problem based assessment and plan for further details.    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 CESAREAN SECTION    . IR TRANSCATH PLC STENT 1ST ART NOT LE CV CAR VERT CAR  01/23/2020  . IR US GUIDE VASC ACCESS LEFT  01/23/2020  . IR VENO/EXT/UNI LEFT  01/23/2020    Medications:  ASA 81 mg QD Eliquis 5 mg BID   Allergies: NKDA   Review of Systems:   Review of Systems  Constitutional: Negative for chills, fever and malaise/fatigue.  Cardiovascular: Positive for leg swelling.  Gastrointestinal: Negative for abdominal pain, blood in stool, constipation, diarrhea and melena.  Genitourinary: Negative for hematuria.  Musculoskeletal: Negative for falls and joint pain.  Neurological: Negative for dizziness and headaches.    Physical Exam:  Vitals:   01/31/20 1055  BP: 109/69  Pulse: 74  Temp: 98.3 F (36.8 C)  TempSrc: Oral  SpO2: 98%  Weight: 124 lb 1.6 oz (56.3 kg)  Height: 4\' 7"  (1.397 m)    General: well-appearing female in NAD  Cardiac: regular rate and rhythm, nl S1/S2, no murmurs, rubs or gallops  Pulm: CTAB, no wheezes or crackles, no increased work of breathing on room air   Ext: warm and well perfused, LLE with brown discoloration from foot to knee as well as nonpitting edema, no warmth, erythema or discharge noted    Assessment & Plan:   See Encounters Tab for problem based charting.  Patient discussed with Dr. 

## 2020-02-04 ENCOUNTER — Encounter: Payer: Self-pay | Admitting: Internal Medicine

## 2020-02-04 NOTE — Assessment & Plan Note (Signed)
Patient was recently admitted to the hospital 2/1-2/4 with left lower extremity edema and was found to have an acute DVT of the femoral vein that was thought to be secondary to chronic venous stasis as well as continental small left external iliac vein.  She underwent angioplasty with stent placement without complications and was started on aspirin 81 daily as well as Eliquis.  She reports feeling better since being discharged home.  He has been compliant with her medications but does not appear to be taking it as prescribed. She is reports taking 4 tablets of Eliquis and 4 tablets of aspirin per day.  Her son is present with her today and confirms this. She denies abdominal pain, melena, hematochezia, and hematuria. I discussed how to take medications appropriately. The in-person interpreter knows the family and offered to go over the medications with patient at home.   - Continue Eliquis 5 mg BID  - ASA 81 mg QD x 3 months  - Compression stockings provided to help with swelling  - IR clinic appt made for 02/2020 - Follow up in 4 weeks

## 2020-02-04 NOTE — Assessment & Plan Note (Signed)
Patient was treated for left lower extremity cellulitis during most recent admission with Keflex x 5 days.  She has completed antibiotic therapy.  No signs of infection noted today on exam.

## 2020-02-06 NOTE — Progress Notes (Signed)
Internal Medicine Clinic Attending  Case discussed with Dr. Santos-Sanchez at the time of the visit.  We reviewed the resident's history and exam and pertinent patient test results.  I agree with the assessment, diagnosis, and plan of care documented in the resident's note.    

## 2020-02-06 NOTE — Addendum Note (Signed)
Addended by: Burnell Blanks on: 02/06/2020 08:58 AM   Modules accepted: Level of Service

## 2020-02-14 NOTE — Telephone Encounter (Signed)
t has presented to clinic for hfu and has established care with clinic, next appt 3/11

## 2020-02-20 ENCOUNTER — Other Ambulatory Visit: Payer: Self-pay | Admitting: Internal Medicine

## 2020-02-20 ENCOUNTER — Ambulatory Visit
Admission: RE | Admit: 2020-02-20 | Discharge: 2020-02-20 | Disposition: A | Discharge status: Home / Self Care | Payer: BC Managed Care – PPO | Source: Ambulatory Visit | Attending: Diagnostic Radiology | Admitting: Diagnostic Radiology

## 2020-02-20 ENCOUNTER — Encounter: Payer: Self-pay | Admitting: Diagnostic Radiology

## 2020-02-20 DIAGNOSIS — I82402 Acute embolism and thrombosis of unspecified deep veins of left lower extremity: Secondary | ICD-10-CM

## 2020-02-20 DIAGNOSIS — I83892 Varicose veins of left lower extremities with other complications: Secondary | ICD-10-CM

## 2020-02-20 NOTE — Progress Notes (Signed)
Patient ID: Olivia Rocha, female   DOB: 03/20/1966, 54 y.o.   MRN: [id number] 54 year old with history of chronic left lower extremity swelling and venous insufficiency.  Patient underwent an angioplasty and stent placement in the left iliac veins on 01/23/2020 as an inpatient.  Patient was scheduled for follow-up at the Interventional Radiology clinic with evaluation for underlying venous insufficiency.  Patient does not speak [place] and presented with an interpreter.  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  Patient is concerned about getting her medications and has significant financial concerns.  Patient briefly told me that she is still having left leg symptoms.  Based on the patient's social and financial situation, we decided not to proceed with formal evaluation of the left lower extremity and venous insufficiency until the patient can get the social and financial situation figured out.  I contacted Dr. Lovenia Kim of Internal Medicine and she will help coordinate the patient's medical and social economic problems.  We will try to schedule the patient for a follow-up visit in the near future.

## 2020-02-20 NOTE — Progress Notes (Signed)
Received below message from Dr. Lowella Dandy,   Dr. Lovenia Kim,   Patient came to IR clinic today for follow up of left leg cellulitis and post iliac venous stenting. All kinds of social and financial issues. Patient only had interpreter with her today. Her insurance has run out and looks like she is about to run out of Eliquis. Sounds like there are even issues with food and living situation. I briefly looked at her leg and she looks to have chronic venous insufficiency and will need a formal work-up with [place] and probable venous ablation as outpatient. This work-up is not not emergent and do not want to stick her with a large bill. Does your department have a way of helping this women get financial assistance and making sure she gets medication refills?    Thanks,  Richarda Overlie     She is scheduled for an [facility] appt on 3/11. We will give her a sample of Eliquis then and refer her to case management to address socioeconomic situation.   Burna Cash, MD  Internal Medicine PGY-3

## 2020-02-26 NOTE — Congregational Nurse Program (Signed)
  Dept: [place] Nurse Program Note  Date of Encounter: 02/26/2020  Past Medical History: No past medical history on file.  Encounter Details: CNP Questionnaire - 02/26/20 1000      Questionnaire   Patient Status  Refugee    Race  African    Location Patient Served At  Not Applicable    Insurance  Not Applicable    Uninsured  Uninsured (NEW 1x/quarter)    Food  Yes, have food insecurities    Housing/Utilities  Worried about losing housing    Transportation  Yes, need transportation assistance    Interpersonal Safety  Yes, feel physically and emotionally safe where you currently live    Medication  Yes, have medication insecurities;Provided medication assistance    Medical Provider  Yes    Referrals  Not Applicable    ED Visit Averted  Not Applicable    Life-Saving Intervention Made  Not Applicable      Ms Colon was not present at this meeting today due to medical issues.I have met with her husband and her 2 highschool sons.Ms Hoobler is  uninsured since she lost her job.Family is struggling with medical bills,food and are worried about rent. Case manager here at [facility] will assist with food stump application but need a doctors note stating she was admitted and discharged from hospital and that she has medical issue and unable to return to work. Patient will see the Doctor on 3/11 and I have advised the family to get a letter from doctor on this coming visit. Honor Loh RN BSN PCCN [phone]

## 2020-02-28 ENCOUNTER — Encounter: Payer: Self-pay | Admitting: Internal Medicine

## 2020-02-28 ENCOUNTER — Ambulatory Visit: Payer: BC Managed Care – PPO

## 2020-03-17 ENCOUNTER — Other Ambulatory Visit: Payer: Self-pay

## 2020-03-17 ENCOUNTER — Ambulatory Visit (INDEPENDENT_AMBULATORY_CARE_PROVIDER_SITE_OTHER): Payer: Self-pay | Admitting: Internal Medicine

## 2020-03-17 ENCOUNTER — Encounter: Payer: Self-pay | Admitting: Internal Medicine

## 2020-03-17 VITALS — BP 102/75 | HR 81 | Temp 98.0°F | Ht <= 58 in | Wt 126.0 lb

## 2020-03-17 DIAGNOSIS — I872 Venous insufficiency (chronic) (peripheral): Secondary | ICD-10-CM

## 2020-03-17 DIAGNOSIS — Z7982 Long term (current) use of aspirin: Secondary | ICD-10-CM

## 2020-03-17 DIAGNOSIS — Z86718 Personal history of other venous thrombosis and embolism: Secondary | ICD-10-CM

## 2020-03-17 DIAGNOSIS — Z789 Other specified health status: Secondary | ICD-10-CM

## 2020-03-17 DIAGNOSIS — Z09 Encounter for follow-up examination after completed treatment for conditions other than malignant neoplasm: Secondary | ICD-10-CM

## 2020-03-17 DIAGNOSIS — Z95820 Peripheral vascular angioplasty status with implants and grafts: Secondary | ICD-10-CM

## 2020-03-17 DIAGNOSIS — Z9114 Patient's other noncompliance with medication regimen: Secondary | ICD-10-CM

## 2020-03-17 DIAGNOSIS — I824Y2 Acute embolism and thrombosis of unspecified deep veins of left proximal lower extremity: Secondary | ICD-10-CM

## 2020-03-17 MED ORDER — RIVAROXABAN (XARELTO) VTE STARTER PACK (15 & 20 MG): ORAL_TABLET | ORAL | 0 refills | Status: DC

## 2020-03-17 MED ORDER — ASPIRIN EC 81 MG PO TBEC: 81.0000 mg | DELAYED_RELEASE_TABLET | Freq: Every day | ORAL | 0 refills | Status: DC

## 2020-03-17 NOTE — Progress Notes (Signed)
   CC: Follow up for DVT, social and financial issues.  HPI:[name] is a 54 y.o. female who presents for evaluation of treatment for DVT and to discuss barrier to healthcare. Please see individual problem based A/P for details.  No past medical history on file. Review of Systems:  ROS negative except as per HPI.  Physical Exam: Vitals:   03/17/20 1531  BP: 102/75  Pulse: 81  Temp: 98 F (36.7 C)  TempSrc: Oral  SpO2: 99%  Weight: 126 lb (57.2 kg)  Height: 4\' 7"  (1.397 m)   General: NAD, nl appearance HE: Normocephalic, atraumatic , EOMI, Conjunctivae normal ENT: No congestion, no rhinorrhea, no exudate or erythema  Cardiovascular: Normal rate, regular rhythm.  No murmurs, rubs, or gallops Pulmonary : Effort normal, breath sounds normal. No wheezes, rales, or rhonchi Ext: Lower extremities are both warm , equal calf circumference,  non pitting edema in LLE, brown discoloration of LLE around ankle to mid shin   Assessment & Plan:   See Encounters Tab for problem based charting.  Patient discussed with Dr. 

## 2020-03-18 ENCOUNTER — Encounter: Payer: Self-pay | Admitting: Internal Medicine

## 2020-03-18 DIAGNOSIS — Z789 Other specified health status: Secondary | ICD-10-CM | POA: Insufficient documentation

## 2020-03-18 NOTE — Assessment & Plan Note (Addendum)
Patient presents today with in person interpretor. 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 She lost her job, and is now without insurance.  The patient presents a $2800 Covid stimulus check she has been carrying around and does not know what it is. She also has a bag of medication her son bought, because she ran out of her apixiban. She has stopped taking her Asprin as well. She expresses trust in my care, but her language barrier is effecting her understanding. Plan: See DVT problem for further care for DVT - Referral to CCM for Acute DVT and Med assistance (DOAC), medicare application / orange card.  - Continue Working with Engineer, water ( patient reports she in person church has been cancelled, I will reach out to nurse to see if she can assist in insuring medication compliance)

## 2020-03-18 NOTE — Assessment & Plan Note (Signed)
  DVT: Patient discharged from hospital 2/04 after workup for first DVT. Patient has chronic venous stasis, and found to have abnormally small left external iliac vein.  On 2/3 she underwent overlapping stent placement and angioplasty with IR surgery, Dr.Watts.   She was sent to IR clinic for follow-up at the beginning of March, but patient found to be having multiple financial isssues limiting her care. She was sent back to clinic today and is not currently on any treatment. She has a bag of medication of herbal supplements she was told could help. My recommendation are to take the medication we have prescribed. I will send these prescription to  [facility] , they are on the $4 list. She has been off of Apixaban since 3/03. Plan:  - Continue Asprin for 3 months from 01/24/2020 - Xarelto starter pack, continue Xarelto for at least 6 months - Plan to send referral to IR Surgery for further evaluation of venous insufficiency after patient has insurance

## 2020-03-18 NOTE — Progress Notes (Signed)
Internal Medicine Clinic Attending ° °Case discussed with Dr. Steen  at the time of the visit.  We reviewed the resident’s history and exam and pertinent patient test results.  I agree with the assessment, diagnosis, and plan of care documented in the resident’s note.  °

## 2020-03-25 ENCOUNTER — Ambulatory Visit: Payer: Self-pay | Admitting: *Deleted

## 2020-03-25 NOTE — Chronic Care Management (AMB) (Signed)
  Chronic Care Management   Note  03/25/2020 Name: Sammy Douthitt MRN: [id number] DOB: [birth year]  Pharmacy referral sent to [facility] for medication assistance related to DVT.   Follow up plan: [facility].   Marja Kays MHA,BSN,RN,CCM [facility]  [facility] Care Management Coordinator Direct Dial:  [phone]  Fax: [phone]

## 2020-03-26 ENCOUNTER — Other Ambulatory Visit: Payer: Self-pay

## 2020-03-26 DIAGNOSIS — M7989 Other specified soft tissue disorders: Secondary | ICD-10-CM

## 2020-03-26 LAB — GLUCOSE, POCT (MANUAL RESULT ENTRY): POC Glucose: 90 mg/dl (ref 70–99)

## 2020-03-26 NOTE — Congregational Nurse Program (Signed)
  Dept: [phone]   Congregational Nurse Program Note  Date of Encounter: 03/26/2020  Past Medical History: No past medical history on file.  Encounter Details: CNP Questionnaire - 03/26/20 1220      Questionnaire   Patient Status  Refugee    Race  African    Location Patient Served At  [employer]  Not Applicable    Uninsured  Uninsured (NEW 1x/quarter)    Food  Yes, have food insecurities    Housing/Utilities  Worried about losing housing    Transportation  Yes, need transportation assistance    Interpersonal Safety  Yes, feel physically and emotionally safe where you currently live    Medication  Yes, have medication insecurities    Medical Provider  Yes    Referrals  Not Applicable    ED Visit Averted  Not Applicable    Life-Saving Intervention Made  Not Applicable     Ms Olivia Rocha came in to see me with c/o left leg swelling. She is being followed by specialist for chronic venous insufficiency.She is on ASA and Xarelto. She states to be taking meds as prescribed.She is to switch Xarelto  on day 22nd to once daily per order.I have requested her to bring the medications on her next visit to review and re-educated her. Education provided on heart healthy diet and moderate exercise. Arman Bogus RN BSN PCCN [phone]

## 2020-04-01 NOTE — Congregational Nurse Program (Signed)
  Dept: [phone]   Congregational Nurse Program Note  Date of Encounter: 04/01/2020  Past Medical History: No past medical history on file.  Encounter Details: CNP Questionnaire - 04/01/20 1100      Questionnaire   Patient Status  Refugee    Race  African    Location Patient Served At  [employer]  Not Applicable    Uninsured  Uninsured (NEW 1x/quarter)    Food  Yes, have food insecurities    Housing/Utilities  Worried about losing housing    Transportation  Yes, need transportation assistance    Interpersonal Safety  Yes, feel physically and emotionally safe where you currently live    Medication  Yes, have medication insecurities    Medical Provider  Yes    Referrals  Not Applicable    ED Visit Averted  Not Applicable    Life-Saving Intervention Made  Not Applicable     client brought on prescribed medication for review.She is taking both xarelto and ASA as prescribed.She understands that she will change xarelto frequency from BID to daily on day 22nd. She has no other concerns at this time. Arman Bogus RN BSN PCCN  [phone]

## 2020-04-15 NOTE — Congregational Nurse Program (Signed)
  Dept: [phone]   Congregational Nurse Program Note  Date of Encounter: 04/15/2020  Past Medical History: No past medical history on file.  Encounter Details: CNP Questionnaire - 04/15/20 1100      Questionnaire   Patient Status  Refugee    Race  African    Location Patient Served At  [employer]  Not Applicable    Uninsured  Uninsured (Subsequent visits/quarter)    Food  Yes, have food insecurities    Housing/Utilities  Worried about losing housing    Transportation  Yes, need transportation assistance    Interpersonal Safety  Yes, feel physically and emotionally safe where you currently live    Medication  Yes, have medication insecurities    Medical Provider  Yes    Referrals  Medication Assistance    ED Visit Averted  Yes    Life-Saving Intervention Made  Not Applicable     Client came in requesting medication assistance.Client has ran out of Xarelto. I have called [facility] case manager to assist with medication.She will get back to me. Nicole Cella Khylah Kendra RN BSN PCCN [phone]-office [phone]-cell

## 2020-04-18 ENCOUNTER — Ambulatory Visit: Payer: Self-pay | Admitting: *Deleted

## 2020-04-18 DIAGNOSIS — I824Y2 Acute embolism and thrombosis of unspecified deep veins of left proximal lower extremity: Secondary | ICD-10-CM

## 2020-04-18 NOTE — Chronic Care Management (AMB) (Signed)
  Care Management   Note  04/18/2020 Name: Olivia Rocha MRN: [id number] DOB: Feb 16, 1966  Received referral from [facility] care management assistant Iverson Alamin on 4/27//21 after congregational nurse Dorothy Muhoro called the [facility] CM office on behalf of the patient for to assistance with Xarelto. Secure e-mail sent to congregational nurse Ms. Muhoro with [employer] and [employer] patient assistance program application form with directions to contact this CCM RN for questions/concerns in completing application  . The care management team will reach out to the patient again over the next 7 days.   Cranford Mon RN, CCM, CDCES CCM Clinic RN Care Manager [phone]

## 2020-04-21 ENCOUNTER — Telehealth: Payer: Self-pay | Admitting: *Deleted

## 2020-04-21 ENCOUNTER — Ambulatory Visit: Payer: Self-pay | Admitting: *Deleted

## 2020-04-21 ENCOUNTER — Telehealth: Payer: Self-pay

## 2020-04-21 DIAGNOSIS — R7303 Prediabetes: Secondary | ICD-10-CM

## 2020-04-21 DIAGNOSIS — I824Y2 Acute embolism and thrombosis of unspecified deep veins of left proximal lower extremity: Secondary | ICD-10-CM

## 2020-04-21 NOTE — Chronic Care Management (AMB) (Signed)
  Care Management   Note  04/21/2020 Name: Olivia Rocha MRN: [id number] DOB: 06/17/1966  Reached patient's Congregational RN Olivia Rocha to follow up on e-mail sent to [name] on 04/18/20 regarding Xarelto assistance.  Olivia Rocha states she will be able to assist with completion of the [employer] and [facility] Patient Assistance Application form and fax to the company. Advised Ms. Rocha this CCM RN will also see if Xarelto samples are available from the Internal Medicine Clinic as patient is out of Xarelto.   Advised [name] to contact this CCM RN for questions or concerns related to applying to patient assistance program for Xarelto.   Cranford Mon RN, CCM, CDCES CCM Clinic RN Care Manager [phone]

## 2020-04-21 NOTE — Telephone Encounter (Signed)
Dr. Barbaraann Faster, Please see the note below from CCM. I called [facility] and was told the patient picked up her Xarelto starter pack on 03/17/20 and did present a [employer] plan, [facility], to use towards the medication.  MC-OP also gave her a coupon.  If you would like the patient to have samples of Xarelto until CCM gets the PAP worked out, please add a new RX under the med list for Xarelto 20mg  daily.  If you approve, we can pull some samples for her.  Thank you, SChaplin, RN,BSN

## 2020-04-21 NOTE — Telephone Encounter (Signed)
-----   Message from Bary Richard, RN sent at 04/21/2020 10:07 AM EDT ----- Regarding: Xarelto samples Happy Monday, [facility] is not able to help this patient with her Xarelto. I have emailed a [employer] PAP application to her Congregational Nurse to assist patient with completing but the patient is out of Xarelto. I read in Dr Mallie Mussel OV note of 03/17/20 that he provided patient with a Xarelto starter pack. Does the clinic have any more Xarelto available for this patient until it can be determined if she is eligible for the  PAP? Thanks, Marylu Lund

## 2020-04-21 NOTE — Progress Notes (Signed)
Internal Medicine Clinic Resident   I have personally reviewed this encounter including the documentation in this note and/or discussed this patient with the care management provider. I will address any urgent items identified by the care management provider and will communicate my actions to the patient's PCP. I have reviewed the patient's CCM visit with my supervising attending.  Eshawn Coor, MD 04/21/2020    

## 2020-04-21 NOTE — Telephone Encounter (Signed)
Spoke with patient's congregational RN Arman Bogus, she states the patient lost her job and is uninsured so she would benefit from samples of Xarelto. Nicole Cella says she can pick the samples up for the patient when they are ready. Advised her we are awaiting providers input on correct Xarelto dose. Cranford Mon RN, CCM, CDCES CCM Clinic RN Care Manager [phone]

## 2020-04-21 NOTE — Progress Notes (Signed)
Internal Medicine Clinic Attending  CCM services provided by the care management provider and their documentation were discussed with Dr. Chundi. We reviewed the pertinent findings, urgent action items addressed by the resident and non-urgent items to be addressed by the PCP.  I agree with the assessment, diagnosis, and plan of care documented in the CCM and resident's note.  Verdelle Valtierra C Josmar Messimer, DO 04/21/2020  

## 2020-04-22 ENCOUNTER — Ambulatory Visit: Payer: Self-pay | Admitting: *Deleted

## 2020-04-22 ENCOUNTER — Other Ambulatory Visit: Payer: Self-pay | Admitting: *Deleted

## 2020-04-22 ENCOUNTER — Other Ambulatory Visit: Payer: Self-pay | Admitting: Internal Medicine

## 2020-04-22 DIAGNOSIS — I824Y2 Acute embolism and thrombosis of unspecified deep veins of left proximal lower extremity: Secondary | ICD-10-CM

## 2020-04-22 DIAGNOSIS — R7303 Prediabetes: Secondary | ICD-10-CM

## 2020-04-22 MED ORDER — RIVAROXABAN 20 MG PO TABS: 20.0000 mg | ORAL_TABLET | Freq: Every day | ORAL | 2 refills | Status: DC

## 2020-04-22 MED FILL — XARELTO 20 MG TABLET: 20 | 30 days supply | Qty: 30 | Fill #0

## 2020-04-22 NOTE — Chronic Care Management (AMB) (Signed)
  Care Management   Note  04/22/2020 Name: Olivia Rocha MRN: [id number] DOB: 1966-11-19   Returned call to patient's congregational nurse Nicole Cella Muhoro at and advised her she can pick up 20 mg samples of Xarelto at the clinic.   Will ask Dorothy to contact this CCM RN once a determination for medication assistance from [employer] and [place] Patient [employer] is determined.  Cranford Mon RN, CCM, CDCES CCM Clinic RN Care Manager [phone]

## 2020-04-22 NOTE — Congregational Nurse Program (Signed)
  Dept: [phone]   Congregational Nurse Program Note  Date of Encounter: 04/22/2020  Past Medical History: No past medical history on file.  Encounter Details: CNP Questionnaire - 04/22/20 1427      Questionnaire   Patient Status  Refugee    Race  African    Location Patient Served At  [employer] [employer]  Not Applicable    Uninsured  Uninsured (Subsequent visits/quarter)    Food  Yes, have food insecurities    Housing/Utilities  Worried about losing housing    Transportation  Yes, need transportation assistance    Interpersonal Safety  Yes, feel physically and emotionally safe where you currently live    Medication  Yes, have medication insecurities    Medical Provider  Yes    Referrals  Medication Assistance    ED Visit Averted  Yes    Life-Saving Intervention Made  Not Applicable     Ms Ulah came in requesting medication assistance. I have informed her that we wiill need to complete application and fax to a pharmaceutical company for assistance.Meanwhile, I wait for a call from the doctors office to pick up free samples on her behalf. Nicole Cella Florance Paolillo RN BSN PCCN [phone]-cell

## 2020-04-22 NOTE — Telephone Encounter (Signed)
Thank you to everyone working to insure Olivia Rocha has her medications. The order for Xarelto 20 mg daily has been placed. Stacee, will you reach out to the patient and ask her to stop Asprin? She has finished the 3 month course recommended by vascular surgery after having stents placed.

## 2020-04-22 NOTE — Progress Notes (Signed)
The order for Rivaroxaban 20 mg has been placed. Please have patient discontinue Asprin, she has completed the 3 month course recommended by vascular surgery.

## 2020-04-23 ENCOUNTER — Telehealth: Payer: Self-pay

## 2020-04-23 ENCOUNTER — Telehealth: Payer: Self-pay | Admitting: *Deleted

## 2020-04-23 NOTE — Telephone Encounter (Signed)
Olivia Rocha, pt has the application from [employer] for PA the cong nurse is helping her with it, her name is dorothym. [phone]

## 2020-04-23 NOTE — Telephone Encounter (Signed)
I have called Ms Ravina to inform her thank I will be delivering free samples from her doctors office to herhome today. She understands to take one tablet daily and to stop taking aspirin as per the Doctors instructions.I have also informed her about the application for medication assistance and for orange card. She is to bring the documents needed to me next week. Nicole Cella Dyllon Henken RN BSN PCCN [phone]-cell

## 2020-04-25 NOTE — Telephone Encounter (Signed)
Thank you :)

## 2020-04-25 NOTE — Telephone Encounter (Signed)
Congregational nurse instructed patient to stop taking ASA, as well as, given her medication samples with instructions for Xarelto, and update on patient assistance per her note on 04/23/20. SChaplin, RN,BSN

## 2020-04-29 ENCOUNTER — Telehealth: Payer: Self-pay

## 2020-04-29 ENCOUNTER — Ambulatory Visit: Payer: Self-pay | Admitting: *Deleted

## 2020-04-29 DIAGNOSIS — I824Y2 Acute embolism and thrombosis of unspecified deep veins of left proximal lower extremity: Secondary | ICD-10-CM

## 2020-04-29 NOTE — Chronic Care Management (AMB) (Signed)
  Care Management   Note  04/29/2020 Name: Sury Wentworth MRN: [id number] DOB: [birth year]  Received the following In Basket message from patient's congregational nurse Arman Bogus.  "I called [facility] outpatient pharmacy and Ms Mellissa will get this medication at $4:00 copay and therefore she does not need Patient Assistance Application form completed as long as she can afford the copay.This is per the pharmacist that I spoke with.Ms Latresha did pick up the medication today.  Thank you all for assisting.  Dorothy. "   No further follow up required: per patient's congregational nurse.  Cranford Mon RN, CCM, CDCES CCM Clinic RN Care Manager [phone]

## 2020-04-29 NOTE — Telephone Encounter (Signed)
I have called [facility] out patient pharmacy and verified that Ms Stasia can pick up her medication at $4:00 copay.I have provided transportation and Ms Elena has picked up xarelto medication. I had educated her on taking this medication per MD instructions. Kori Colin RN BSn PCCn [phone]-cell

## 2020-04-30 ENCOUNTER — Telehealth: Payer: Self-pay

## 2020-04-30 NOTE — Telephone Encounter (Signed)
Appointment for orange card set for June 7th 2021 @ 2:30 pm Arman Bogus RN BSN PCCN [phone]

## 2020-04-30 NOTE — Telephone Encounter (Signed)
I have set up appointment for orange card on June 8th @2 :30 pm. Patient informed and agrees to  the set date and time of appointment. Thaddius Manes RN BSn PCCN [phone]-cell

## 2020-05-07 NOTE — Telephone Encounter (Signed)
Olivia Rocha, does triage need to do something about this?

## 2020-05-13 DIAGNOSIS — F5089 Other specified eating disorder: Secondary | ICD-10-CM

## 2020-05-13 LAB — GLUCOSE, POCT (MANUAL RESULT ENTRY): POC Glucose: 85 mg/dl (ref 70–99)

## 2020-05-13 NOTE — Telephone Encounter (Signed)
I spoke to Olivia Rocha and the patient is able to afford the $4 at [facility] and will not be applying for patient assistance through [employer] at this time.

## 2020-05-13 NOTE — Congregational Nurse Program (Signed)
  Dept: [phone]   Congregational Nurse Program Note  Date of Encounter: 05/13/2020  Past Medical History: No past medical history on file.  Encounter Details: CNP Questionnaire - 05/13/20 1121      Questionnaire   Patient Status  Refugee    Race  African    Location Patient Served At  [employer]  Not Applicable    Uninsured  Uninsured (Subsequent visits/quarter)    Food  Yes, have food insecurities    Housing/Utilities  Worried about losing housing    Transportation  Yes, need transportation assistance    Interpersonal Safety  Yes, feel physically and emotionally safe where you currently live    Medication  Yes, have medication insecurities    Medical Provider  Yes    Referrals  Medication Assistance    ED Visit Averted  Yes    Life-Saving Intervention Made  Not Applicable     Client came in c/o lack of appetite. She reports to be having normal bowel movements. I have checked her blood sugar and is normal.She reports some abdominal discomfort which she described as distention.She declines to see a doctor today. I have advised her to try OTC multivitamin.. I will reassess tomorrow. Nicole Cella Aleza Pew RN BSN PCCN [phone]-cell

## 2020-05-14 NOTE — Congregational Nurse Program (Signed)
  Dept: [phone]   Congregational Nurse Program Note  Date of Encounter: 05/14/2020  Past Medical History: No past medical history on file.  Encounter Details: CNP Questionnaire - 05/14/20 1303      Questionnaire   Patient Status  Refugee    Race  African    Location Patient Served At  [employer]  Not Applicable    Uninsured  Uninsured (Subsequent visits/quarter)    Food  Yes, have food insecurities    Housing/Utilities  Worried about losing housing    Transportation  Yes, need transportation assistance    Interpersonal Safety  Yes, feel physically and emotionally safe where you currently live    Medication  Yes, have medication insecurities    Medical Provider  Yes    Referrals  Medication Assistance    ED Visit Averted  Not Applicable    Life-Saving Intervention Made  Not Applicable

## 2020-05-21 NOTE — Congregational Nurse Program (Signed)
  Dept: [phone]   Congregational Nurse Program Note  Date of Encounter: 05/21/2020  Past Medical History: No past medical history on file.  Encounter Details: CNP Questionnaire - 05/21/20 1227      Questionnaire   Patient Status  Refugee    Race  African    Location Patient Served At  [employer]  Not Applicable    Uninsured  Uninsured (Subsequent visits/quarter)    Food  Yes, have food insecurities    Housing/Utilities  Worried about losing housing    Transportation  Yes, need transportation assistance    Interpersonal Safety  Yes, feel physically and emotionally safe where you currently live    Medication  Yes, have medication insecurities    Medical Provider  Yes    Referrals  Medication Assistance    ED Visit Averted  Not Applicable    Life-Saving Intervention Made  Not Applicable       Ms Olivia Rocha came in requesting  appointment to see the Doctor for check up.Appointment scheduled with PCP for June 7th @ 3:15 pm. She is also scheduled to see financial counselor same day at 2:30pm. Patient understands to avail herself.  Olivia Cella Kirra Verga RN BSN PCCN [phone] [phone]

## 2020-05-26 ENCOUNTER — Ambulatory Visit (INDEPENDENT_AMBULATORY_CARE_PROVIDER_SITE_OTHER): Payer: Self-pay | Admitting: Internal Medicine

## 2020-05-26 ENCOUNTER — Ambulatory Visit: Payer: Self-pay

## 2020-05-26 VITALS — BP 124/80 | HR 90 | Temp 98.4°F | Ht <= 58 in | Wt 128.8 lb

## 2020-05-26 DIAGNOSIS — I83892 Varicose veins of left lower extremities with other complications: Secondary | ICD-10-CM

## 2020-05-26 DIAGNOSIS — I771 Stricture of artery: Secondary | ICD-10-CM

## 2020-05-26 DIAGNOSIS — M79605 Pain in left leg: Secondary | ICD-10-CM

## 2020-05-26 DIAGNOSIS — I824Y2 Acute embolism and thrombosis of unspecified deep veins of left proximal lower extremity: Secondary | ICD-10-CM

## 2020-05-26 MED ORDER — NAPROXEN 500 MG PO TABS: 500.0000 mg | ORAL_TABLET | Freq: Two times a day (BID) | ORAL | 0 refills | Status: DC

## 2020-05-26 NOTE — Patient Instructions (Addendum)
Urakoze kudusura mumavuriro uyumunsi. Hano hepfo ni incamake y'ibyo twaganiriye:  1. Amaraso Carolin Sicks gufata Xarelto 20 mg buri munsi  2. Kubabara ukuguru -Maylene Roes gufata Tylenol 650 mg buri masaha 6 -Nategetse naproxen 500 mg kabiri kumunsi hamwe [employer].  3. Gukurikirana - [place] dukurikirane natwe mumezi 1 kugeza 2  Niba ufite ibibazo cyangwa impungenge hagati aho, nyamuneka utugereho.Marland Kitchen

## 2020-05-26 NOTE — Progress Notes (Signed)
   CC: DVT follow up   HPI:  [name] is a 54 y.o. with a hx of DVT in February 2021 who presents today for DVT follow up. Please refer to the problem based chartering for further details.   No past medical history on file.   Review of Systems:  All systems were reviewed and are negative unless mentioned in the HPI.  Physical Exam:  Vitals:   05/26/20 1508  BP: 124/80  Pulse: 90  Temp: 98.4 F (36.9 C)  TempSrc: Oral  SpO2: 95%  Weight: 128 lb 12.8 oz (58.4 kg)  Height: 4\' 8"  (1.422 m)   Physical Exam Vitals reviewed.  Constitutional:      General: She is not in acute distress.    Appearance: Normal appearance. She is normal weight. She is not ill-appearing or toxic-appearing.  HENT:     Head: Normocephalic and atraumatic.  Cardiovascular:     Rate and Rhythm: Normal rate and regular rhythm.     Heart sounds: No murmur. No friction rub. No gallop.   Pulmonary:     Effort: Pulmonary effort is normal. No respiratory distress.     Breath sounds: Normal breath sounds. No wheezing or rales.  Abdominal:     General: Abdomen is flat. Bowel sounds are normal. There is no distension.     Palpations: Abdomen is soft.     Tenderness: There is no abdominal tenderness. There is no guarding.  Musculoskeletal:        General: Swelling (LLE with varicose veins. Nontender to palpation) present.     Right lower leg: No edema.     Left lower leg: No edema.  Skin:    General: Skin is warm.  Neurological:     General: No focal deficit present.     Mental Status: She is alert and oriented to person, place, and time.  Psychiatric:        Mood and Affect: Mood normal.    Assessment & Plan:   See Encounters Tab for problem based charting.  Patient discussed with Dr. 

## 2020-05-27 NOTE — Assessment & Plan Note (Addendum)
The patient complains of pain and tenderness at the site of procedure access.  There is a well-healed scar present with no surrounding erythema or fluctuance.  Patient states that she feels a sharp pain upon ambulation or when laying in certain positions in bed.  She has not tried taking any medications to help relieve the pain.  I suspect patient may have scar tissue buildup that is leading to some discomfort.  It does not impede her from completing her daily activities.  Plan: -Naproxen 500 mg twice daily with food as needed

## 2020-05-27 NOTE — Assessment & Plan Note (Addendum)
Patient was supposed to follow-up with IR in March to get further treatment for varicose veins in the left leg.  Unfortunately, the patient was unable to follow-up due to financial barriers and being uninsured.    Plan: -Discuss process of obtaining orange card at next visit

## 2020-05-27 NOTE — Assessment & Plan Note (Addendum)
Patient was hospitalized and discharged on 2/4 after work-up for her first DVT.  On 2/3, the patient underwent overlapping stent placement and angioplasty with IR.  Unfortunately, the patient was unable to follow-up with the IR clinic in March due to financial barriers.  Plan: -Xarelto 20 mg daily to complete 46-month course (august)

## 2020-06-02 NOTE — Progress Notes (Signed)
Internal Medicine Clinic Attending  Case discussed with Dr. Alexander at the time of the visit.  We reviewed the resident's history and exam and pertinent patient test results.  I agree with the assessment, diagnosis, and plan of care documented in the resident's note.  

## 2020-06-03 MED FILL — XARELTO 20 MG TABLET: 20 | 30 days supply | Qty: 30 | Fill #1

## 2020-06-03 NOTE — Congregational Nurse Program (Signed)
  Dept: [phone]   Congregational Nurse Program Note  Date of Encounter: 06/03/2020  Past Medical History: No past medical history on file.  Encounter Details:  CNP Questionnaire - 06/03/20 1133      Questionnaire   Patient Status Refugee    Race African    Location Patient Served At [employer] Not Applicable    Uninsured Uninsured (Subsequent visits/quarter)    Food Yes, have food insecurities    Housing/Utilities Worried about losing housing    Transportation Yes, need transportation assistance    Interpersonal Safety Yes, feel physically and emotionally safe where you currently live    Medication Yes, have medication insecurities    Medical Provider Yes    Referrals Medication Assistance    ED Visit Averted Not Applicable    Life-Saving Intervention Made Not Applicable         I have called [facility] out patient pharmacy to refill xarelto.Unfortunately pharmacy is unable to refill at this time. She had 2 accounts with her last name misspelled. After reconciling the 2 accounts, she no longer qualifies for the 4 dollar co-pay. I have called her previous insurance and confirmed that her account  Is no longer active. She is uninsured. [facility] out patient pharmacy will get back to me and decide way forward.  Nicole Cella Othelia Riederer RN BSn PCCN [phone]-cell

## 2020-06-04 NOTE — Congregational Nurse Program (Signed)
  Dept: [phone]   Congregational Nurse Program Note  Date of Encounter: 06/04/2020  Past Medical History: No past medical history on file.  Encounter Details:  CNP Questionnaire - 06/04/20 1213      Questionnaire   Patient Status Refugee    Race African    Location Patient Served At [employer] Not Applicable    Uninsured Uninsured (Subsequent visits/quarter)    Food Yes, have food insecurities    Housing/Utilities Worried about losing housing    Transportation Yes, need transportation assistance    Interpersonal Safety Yes, feel physically and emotionally safe where you currently live    Medication Yes, have medication insecurities    Medical Provider Yes    Referrals Medication Assistance    ED Visit Averted Not Applicable    Life-Saving Intervention Made Not Applicable         I received call from pharmacy that Xarelto will be refilled with $4:00 copay. Client made aware, I drove her to pharmacy and she picked up both xarelto and Naproxen.I have explained risks of increased bleeding with both medication and to take Naproxen with food only when needed for pain.She verbalized Understanding. She has no other concerns at this time.  Arman Bogus RN BSN PCCn  [phone]-office

## 2020-08-26 ENCOUNTER — Telehealth: Payer: Self-pay

## 2020-08-26 MED FILL — XARELTO 20 MG TABLET: 20 | 30 days supply | Qty: 30 | Fill #2

## 2020-08-26 NOTE — Telephone Encounter (Signed)
Contacted [facility] outpatient for medication refill.  Nicole Cella Zuly Belkin RN BSN PCCn  [facility] Congregational Nurse [phone]-cell [phone]-office

## 2020-09-23 ENCOUNTER — Telehealth: Payer: Self-pay

## 2020-09-23 ENCOUNTER — Other Ambulatory Visit: Payer: Self-pay | Admitting: Internal Medicine

## 2020-09-23 NOTE — Telephone Encounter (Signed)
Contacted [facility] outpatient pharmacy for refill. No refills available for xarelto. Request for refill sent to Dr Barbaraann Faster  pharmacist. I will follow up tomorrow. Nicole Cella Zalyn Amend RN BSN PCCN [phone]-office

## 2020-09-26 ENCOUNTER — Telehealth: Payer: Self-pay

## 2020-09-26 NOTE — Telephone Encounter (Signed)
Contacted [facility] outpatient pharmacy. Xarelto not ready for pick up. I will contact MD to "resend the prescription with IM on hard copy per pharmacy request" Arman Bogus RN BSN PCCN  [phone]-office

## 2020-09-29 ENCOUNTER — Other Ambulatory Visit: Payer: Self-pay | Admitting: Internal Medicine

## 2020-09-29 MED ORDER — RIVAROXABAN 20 MG PO TABS: ORAL_TABLET | ORAL | 2 refills | Status: DC

## 2020-09-29 MED FILL — XARELTO 20 MG TABLET: 20 | 30 days supply | Qty: 30 | Fill #0

## 2020-10-01 ENCOUNTER — Telehealth: Payer: Self-pay

## 2020-10-01 NOTE — Telephone Encounter (Signed)
Contacted [facility] for medication pick up. Patient will need to pay $87 for xarelto.I have informed patient the cost and she will get back to me. Arman Bogus RN BSn PCCN  [facility] Congregational Nurse [phone]

## 2020-10-07 MED FILL — XARELTO 20 MG TABLET: 20 | 30 days supply | Qty: 30 | Fill #0

## 2020-10-08 ENCOUNTER — Telehealth: Payer: Self-pay

## 2020-10-08 NOTE — Telephone Encounter (Signed)
Picked up Xarelto medication from [facility] and delivered to patient residence. Nicole Cella Daisha Filosa RN BSN PCCN  [phone] [phone]

## 2020-11-06 ENCOUNTER — Telehealth: Payer: Self-pay

## 2020-11-06 NOTE — Telephone Encounter (Signed)
Ms Johnanna husband called and requested assistance to schedule appointment for increased pain and swelling to LLE. Appointment scheduled for 11/07/20 @10 :15am Zalaya Astarita RN BSN PCCN  [facility] Congregational Nurse [phone]-office

## 2020-11-07 ENCOUNTER — Ambulatory Visit (INDEPENDENT_AMBULATORY_CARE_PROVIDER_SITE_OTHER): Payer: BC Managed Care – PPO | Admitting: Internal Medicine

## 2020-11-07 ENCOUNTER — Ambulatory Visit (HOSPITAL_COMMUNITY): Payer: BC Managed Care – PPO

## 2020-11-07 ENCOUNTER — Other Ambulatory Visit: Payer: Self-pay

## 2020-11-07 VITALS — BP 127/78 | HR 75 | Temp 98.4°F | Ht <= 58 in | Wt 127.5 lb

## 2020-11-07 DIAGNOSIS — R1032 Left lower quadrant pain: Secondary | ICD-10-CM | POA: Diagnosis not present

## 2020-11-07 NOTE — Patient Instructions (Signed)
Ndategetse ultrasound kumaguru kugirango ukore isuzuma kandi alos [place] radiologue interventionaliste kugirango ukurikirane stent [place]. Bamwe mubiro byabo [employer].  Nyamuneka garuka ku biro byacu mu byumweru 1-2.  Niba ufite ikibazo cyangwa impungenge, hamagara ivuriro ryimbere muri [phone].   I order leg ultrasound for you for further evaluation and alos refer you to interventional radiologist for follow up of the stent you have. Some one from their office will call you for the appointment.  Please come back to our office in 1-2 weeks.  Should you have any questions or concerns please call the internal medicine clinic at [phone].

## 2020-11-07 NOTE — Assessment & Plan Note (Signed)
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  On exam, she has tender node at her left groin. Can be a lymphadenopathy but no evidence of infection to suggest reactive lymph node. On prior vascular US, reported to have a hypoechoic round structure with echogenic center seen mesuring 2.7 x 1.3 x 1.2 cm, possibly enlarged lymph node. I will order a repeat US for further evaluation and comparison. Also given prior vascular pathology and DVT of left leg, and as she missed her post stent F/u visit by IR, will refer her to IR clinic for further evaluation. She has trace swelling of left leg and questionable mild tenderness. Does not seen to be acute DVT but given prior Hx, will evaluate with [place] as above.  -Ambulatory referral to IR -Vascular US of left LEE -F/u in clinic in 1 week or sooner

## 2020-11-07 NOTE — Progress Notes (Signed)
Acute Office Visit  Subjective:    Patient ID: Olivia Rocha, female    DOB: December 16, 1966, 54 y.o.   MRN: [id number]  Chief Complaint  Patient presents with  . Pain    pt states that her side ( right ) has been hurting her for two months now    CC: left groin pain  HPI Patient is in today for left groin pain. Language interpreter assisted with translation. Please refer to problem based charting for further details and assessment and plan.  No past medical history on file.  Past Surgical History:  Procedure Laterality Date  . CESAREAN SECTION    . IR TRANSCATH PLC STENT 1ST ART NOT LE CV CAR VERT CAR  01/23/2020  . IR US GUIDE VASC ACCESS LEFT  01/23/2020  . IR VENO/EXT/UNI LEFT  01/23/2020    No family history on file.  Social History   Socioeconomic History  . Marital status: Married    Spouse name: Not on file  . Number of children: Not on file  . Years of education: Not on file  . Highest education level: Not on file  Occupational History  . Not on file  Tobacco Use  . Smoking status: Never Smoker  . Smokeless tobacco: Never Used  Substance and Sexual Activity  . Alcohol use: Yes    Comment: "sometimes"  . Drug use: Never  . Sexual activity: Yes    Birth control/protection: None  Other Topics Concern  . Not on file  Social History Narrative   ** Merged History Encounter **       Social Determinants of Health   Financial Resource Strain:   . Difficulty of Paying Living Expenses: Not on file  Food Insecurity:   . Worried About Programme researcher, broadcasting/film/video in the Last Year: Not on file  . Ran Out of Food in the Last Year: Not on file  Transportation Needs:   . Lack of Transportation (Medical): Not on file  . Lack of Transportation (Non-Medical): Not on file  Physical Activity:   . Days of Exercise per Week: Not on file  . Minutes of Exercise per Session: Not on file  Stress:   . Feeling of Stress : Not on file  Social Connections:   . Frequency of  Communication with Friends and Family: Not on file  . Frequency of Social Gatherings with Friends and Family: Not on file  . Attends Religious Services: Not on file  . Active Member of Clubs or Organizations: Not on file  . Attends Banker Meetings: Not on file  . Marital Status: Not on file  Intimate Partner Violence:   . Fear of Current or Ex-Partner: Not on file  . Emotionally Abused: Not on file  . Physically Abused: Not on file  . Sexually Abused: Not on file    Outpatient Medications Prior to Visit  Medication Sig Dispense Refill  . naproxen (NAPROSYN) 500 MG tablet Take 1 tablet (500 mg total) by mouth 2 (two) times daily with a meal. 30 tablet 0  . rivaroxaban (XARELTO) 20 MG TABS tablet TAKE 1 TABLET (20 MG TOTAL) BY MOUTH DAILY WITH SUPPER. 30 tablet 2   No facility-administered medications prior to visit.    No Known Allergies  Review of Systems  Constitutional: Negative for chills and fever.  Genitourinary: Negative for dysuria, genital sores and vaginal discharge.     Objective:    Physical Exam Constitutional:      General: She  is not in acute distress.    Appearance: Normal appearance. She is not toxic-appearing.  Cardiovascular:     Rate and Rhythm: Normal rate and regular rhythm.     Pulses: Normal pulses.     Heart sounds: Normal heart sounds. No murmur heard.   Abdominal:     Palpations: Abdomen is soft.     Tenderness: There is no abdominal tenderness.  Musculoskeletal:        General: Tenderness present.     Comments: Trace swelling and trace questionable tenderness of left leg  Neurological:     Mental Status: She is alert.     BP 127/78 (BP Location: Left Arm, Patient Position: Sitting, Cuff Size: Normal)   Pulse 75   Temp 98.4 F (36.9 C) (Oral)   Ht 4\' 9"  (1.448 m)   Wt 127 lb 8 oz (57.8 kg)   SpO2 100%   BMI 27.59 kg/m  Wt Readings from Last 3 Encounters:  11/07/20 127 lb 8 oz (57.8 kg)  05/26/20 128 lb 12.8 oz (58.4  kg)  03/17/20 126 lb (57.2 kg)    Health Maintenance Due  Topic Date Due  . Hepatitis C Screening  Never done  . COVID-19 Vaccine (1) Never done  . PAP SMEAR-Modifier  09/10/2016  . MAMMOGRAM  Never done  . COLONOSCOPY  Never done  . INFLUENZA VACCINE  07/20/2020    There are no preventive care reminders to display for this patient.   Lab Results  Component Value Date   TSH 0.761 05/16/2018   Lab Results  Component Value Date   WBC 7.3 01/24/2020   HGB 13.3 01/24/2020   HCT 41.6 01/24/2020   MCV 96.1 01/24/2020   PLT 242 01/24/2020   Lab Results  Component Value Date   NA 143 01/24/2020   K 3.7 01/24/2020   CO2 26 01/24/2020   GLUCOSE 108 (H) 01/24/2020   BUN 5 (L) 01/24/2020   CREATININE 0.54 01/24/2020   BILITOT 1.0 01/21/2020   ALKPHOS 71 01/21/2020   AST 30 01/21/2020   ALT 29 01/21/2020   PROT 6.7 01/21/2020   ALBUMIN 3.1 (L) 01/21/2020   CALCIUM 8.8 (L) 01/24/2020   ANIONGAP 12 01/24/2020   Lab Results  Component Value Date   CHOL 192 05/16/2018   Lab Results  Component Value Date   HDL 64 05/16/2018   Lab Results  Component Value Date   LDLCALC 97 05/16/2018   Lab Results  Component Value Date   TRIG 157 (H) 05/16/2018   Lab Results  Component Value Date   CHOLHDL 3.0 05/16/2018   Lab Results  Component Value Date   HGBA1C 6.1 (A) 05/16/2018       Assessment & Plan:   Problem List Items Addressed This Visit      Other   Left groin pain - Primary    Patient with Hx of provoked left femoral DVT 01/2020 as well as peroneal venous thrombi, 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  On exam, she has tender node at her left groin. Can be a lymphadenopathy but no evidence of infection to suggest reactive lymph node. On prior vascular 02/2020, reported to have a hypoechoic round structure with echogenic center seen mesuring 2.7 x 1.3 x 1.2 cm, possibly  enlarged lymph node. I will order a repeat US for further evaluation and comparison. Also given prior vascular pathology and  DVT of left leg, and as she missed her post stent F/u visit by IR, will refer her to IR clinic for further evaluation. She has trace swelling of left leg and questionable mild tenderness. Does not seen to be acute DVT but given prior Hx, will evaluate with [place] as above.  -Ambulatory referral to IR -Vascular US of left LEE -F/u in clinic in 1 week or sooner      Relevant Orders   VAS [place] LOWER EXTREMITY VENOUS (DVT)   Ambulatory referral to Interventional Radiology       No orders of the defined types were placed in this encounter.    Chevis Pretty, MD

## 2020-11-11 ENCOUNTER — Ambulatory Visit (HOSPITAL_COMMUNITY): Payer: BC Managed Care – PPO | Attending: Student in an Organized Health Care Education/Training Program

## 2020-11-11 NOTE — Progress Notes (Signed)
Internal Medicine Clinic Attending  Case discussed with Dr. Masoudi  At the time of the visit.  We reviewed the resident's history and exam and pertinent patient test results.  I agree with the assessment, diagnosis, and plan of care documented in the resident's note.  

## 2020-11-17 ENCOUNTER — Ambulatory Visit (HOSPITAL_COMMUNITY)
Admission: RE | Admit: 2020-11-17 | Discharge: 2020-11-17 | Disposition: A | Discharge status: Home / Self Care | Payer: BC Managed Care – PPO | Source: Ambulatory Visit | Attending: Student in an Organized Health Care Education/Training Program | Admitting: Student in an Organized Health Care Education/Training Program

## 2020-11-17 ENCOUNTER — Other Ambulatory Visit: Payer: Self-pay

## 2020-11-17 DIAGNOSIS — R1032 Left lower quadrant pain: Secondary | ICD-10-CM

## 2020-11-17 NOTE — Progress Notes (Signed)
VASCULAR LAB    Left lower extremity venous duplex has been performed.  See CV proc for preliminary results.   Aundrea Higginbotham, RVT 11/17/2020, 9:00 AM

## 2020-11-21 ENCOUNTER — Encounter (HOSPITAL_COMMUNITY): Payer: Self-pay | Admitting: Emergency Medicine

## 2020-11-21 ENCOUNTER — Other Ambulatory Visit (HOSPITAL_COMMUNITY): Payer: Self-pay | Admitting: Emergency Medicine

## 2020-11-21 ENCOUNTER — Ambulatory Visit (HOSPITAL_COMMUNITY)
Admission: EM | Admit: 2020-11-21 | Discharge: 2020-11-21 | Disposition: A | Discharge status: Home / Self Care | Payer: BC Managed Care – PPO | Attending: Emergency Medicine | Admitting: Emergency Medicine

## 2020-11-21 ENCOUNTER — Other Ambulatory Visit: Payer: Self-pay

## 2020-11-21 DIAGNOSIS — L03211 Cellulitis of face: Secondary | ICD-10-CM | POA: Diagnosis not present

## 2020-11-21 MED ORDER — DOXYCYCLINE HYCLATE 100 MG PO CAPS: 100.0000 mg | ORAL_CAPSULE | Freq: Two times a day (BID) | ORAL | 0 refills | Status: DC

## 2020-11-21 MED FILL — DOXYCYCLINE HYCLATE 100 MG: 100 | 10 days supply | Qty: 20 | Fill #0

## 2020-11-21 NOTE — Discharge Instructions (Addendum)
Apply warm compresses to the area.  Complete course of antibiotics.  If no improvement or if worsening, particularly in the next two days please return or go to the ER.

## 2020-11-21 NOTE — ED Triage Notes (Addendum)
Patient presents to [facility] for assessment of upper lip swelling x 3 days, with generalized facial and neck pain and feeling hot.  Denies SOB, NAD at triage.  Patient denies any current prescriptions except blood thinner for her leg.

## 2020-11-21 NOTE — ED Provider Notes (Signed)
[facility]    CSN: [id number] Arrival date & time: 11/21/20  1018      History   Chief Complaint Chief Complaint  Patient presents with  . Facial Swelling    HPI Olivia Rocha is a 54 y.o. female.   Olivia Rocha presents with family with complaints of nasal/ upper lip pain and swelling for the past few days, which has been worsening. No known fevers. No new medications or allergen exposures. Has had cellulitis to lower leg in the past. She is on a blood thinner related to DVT, otherwise she does not take any medications long term. No drainage from the area. No trismus. No specific tooth pain.   Her son provides interpretation per patient request.   ROS per HPI, negative if not otherwise mentioned.       History reviewed. No pertinent past medical history.  Patient Active Problem List   Diagnosis Date Noted  . Left groin pain 11/07/2020  . Language barrier affecting health care 03/18/2020  . Cellulitis of left lower extremity 01/31/2020  . 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 DVT (deep venous thrombosis) (HCC) 01/21/2020  . Varicose veins of left leg with edema 11/06/2015  . Tinea pedis of left foot 11/06/2015  . Low TSH level 09/22/2015  . Prediabetes 09/19/2015  . Cervical high risk HPV (human papillomavirus) test positive 09/11/2015    Past Surgical History:  Procedure Laterality Date  . CESAREAN SECTION    . IR TRANSCATH PLC STENT 1ST ART NOT LE CV CAR VERT CAR  01/23/2020  . IR US GUIDE VASC ACCESS LEFT  01/23/2020  . IR VENO/EXT/UNI LEFT  01/23/2020    OB History    Gravida  13   Para  12   Term  12   Preterm  0   AB  1   Living        SAB  1   TAB  0   Ectopic  0   Multiple      Live Births               Home Medications    Prior to Admission medications   Medication Sig Start Date End Date Taking? Authorizing Provider  doxycycline (VIBRAMYCIN) 100 MG capsule Take 1  capsule (100 mg total) by mouth 2 (two) times daily. 11/21/20   Georgetta Haber, NP  naproxen (NAPROSYN) 500 MG tablet Take 1 tablet (500 mg total) by mouth 2 (two) times daily with a meal. 05/26/20 05/26/21  Kirt Boys, MD  rivaroxaban (XARELTO) 20 MG TABS tablet TAKE 1 TABLET (20 MG TOTAL) BY MOUTH DAILY WITH SUPPER. 09/29/20   [place], Karma Ganja D, DO    Family History History reviewed. No pertinent family history.  Social History Social History   Tobacco Use  . Smoking status: Never Smoker  . Smokeless tobacco: Never Used  Substance Use Topics  . Alcohol use: Yes    Comment: "sometimes"  . Drug use: Never     Allergies   Patient has no known allergies.   Review of Systems Review of Systems   Physical Exam Triage Vital Signs ED Triage Vitals [11/21/20 1121]  Enc Vitals Group     BP (!) 145/94     Pulse Rate (!) 107     Resp 20     Temp 98.4 F (36.9 C)     Temp Source Temporal     SpO2 96 %  Weight      Height      Head Circumference      Peak Flow      Pain Score 10     Pain Loc      Pain Edu?      Excl. in GC?    No data found.  Updated Vital Signs BP (!) 145/94 (BP Location: Right Arm)   Pulse (!) 107   Temp 98.4 F (36.9 C) (Temporal)   Resp 20   SpO2 96%    Physical Exam Constitutional:      General: She is not in acute distress.    Appearance: She is well-developed.  HENT:     Nose:      Comments: Red, swollen, firm and exquisitely tender to upper lip at midline and into nose soft tissue; upper lip is generally swollen; no obvious dental injury or abnormality; no fluctuance Cardiovascular:     Rate and Rhythm: Tachycardia present.  Pulmonary:     Effort: Pulmonary effort is normal.  Skin:    General: Skin is warm and dry.  Neurological:     Mental Status: She is alert and oriented to person, place, and time.      UC Treatments / Results  Labs (all labs ordered are listed, but only abnormal results are displayed) Labs  Reviewed - No data to display  EKG   Radiology No results found.  Procedures Procedures (including critical care time)  Medications Ordered in UC Medications - No data to display  Initial Impression / Assessment and Plan / UC Course  I have reviewed the triage vital signs and the nursing notes.  Pertinent labs & imaging results that were available during my care of the patient were reviewed by me and considered in my medical decision making (see chart for details).     Exam concerning for cellulitis with doxycycline provided. Strict return precautions discussed and provided. Patient and son verbalized understanding and agreeable to plan.   Final Clinical Impressions(s) / UC Diagnoses   Final diagnoses:  Facial cellulitis     Discharge Instructions     Apply warm compresses to the area.  Complete course of antibiotics.  If no improvement or if worsening, particularly in the next two days please return or go to the ER.    ED Prescriptions    Medication Sig Dispense Auth. Provider   doxycycline (VIBRAMYCIN) 100 MG capsule Take 1 capsule (100 mg total) by mouth 2 (two) times daily. 20 capsule Georgetta Haber, NP     PDMP not reviewed this encounter.   Georgetta Haber, NP 11/21/20 1205

## 2020-11-24 ENCOUNTER — Other Ambulatory Visit: Payer: Self-pay

## 2020-11-24 ENCOUNTER — Encounter (HOSPITAL_COMMUNITY): Payer: Self-pay | Admitting: *Deleted

## 2020-11-24 ENCOUNTER — Ambulatory Visit (HOSPITAL_COMMUNITY)
Admission: EM | Admit: 2020-11-24 | Discharge: 2020-11-24 | Disposition: A | Discharge status: Home / Self Care | Payer: BC Managed Care – PPO

## 2020-11-24 DIAGNOSIS — L0201 Cutaneous abscess of face: Secondary | ICD-10-CM | POA: Diagnosis not present

## 2020-11-24 NOTE — ED Provider Notes (Signed)
[facility]    CSN: [id number] Arrival date & time: 11/24/20  1105      History   Chief Complaint Chief Complaint  Patient presents with  . Nasal Congestion  . Facial Pain    HPI Olivia Rocha is a 54 y.o. female.   Olivia Rocha presents for follow up related to cellulitis/ abscess to upper lip/ nose. She was seen three days ago and started on doxycycline. Since that time she has since developed purulent drainage. She overall feels improved. No further fevers. Still with pain but it is improving.   Family provides interpretation per patient request.   ROS per HPI, negative if not otherwise mentioned.      History reviewed. No pertinent past medical history.  Patient Active Problem List   Diagnosis Date Noted  . Left groin pain 11/07/2020  . Language barrier affecting health care 03/18/2020  . Cellulitis of left lower extremity 01/31/2020  . 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 DVT (deep venous thrombosis) (HCC) 01/21/2020  . Varicose veins of left leg with edema 11/06/2015  . Tinea pedis of left foot 11/06/2015  . Low TSH level 09/22/2015  . Prediabetes 09/19/2015  . Cervical high risk HPV (human papillomavirus) test positive 09/11/2015    Past Surgical History:  Procedure Laterality Date  . CESAREAN SECTION    . IR TRANSCATH PLC STENT 1ST ART NOT LE CV CAR VERT CAR  01/23/2020  . IR US GUIDE VASC ACCESS LEFT  01/23/2020  . IR VENO/EXT/UNI LEFT  01/23/2020    OB History    Gravida  13   Para  12   Term  12   Preterm  0   AB  1   Living        SAB  1   TAB  0   Ectopic  0   Multiple      Live Births               Home Medications    Prior to Admission medications   Medication Sig Start Date End Date Taking? Authorizing Provider  doxycycline (VIBRAMYCIN) 100 MG capsule Take 1 capsule (100 mg total) by mouth 2 (two) times daily. 11/21/20   Georgetta Haber, NP  naproxen  (NAPROSYN) 500 MG tablet Take 1 tablet (500 mg total) by mouth 2 (two) times daily with a meal. 05/26/20 05/26/21  Kirt Boys, MD  rivaroxaban (XARELTO) 20 MG TABS tablet TAKE 1 TABLET (20 MG TOTAL) BY MOUTH DAILY WITH SUPPER. 09/29/20   [place], Karma Ganja D, DO    Family History History reviewed. No pertinent family history.  Social History Social History   Tobacco Use  . Smoking status: Never Smoker  . Smokeless tobacco: Never Used  Substance Use Topics  . Alcohol use: Yes    Comment: "sometimes"  . Drug use: Never     Allergies   Patient has no known allergies.   Review of Systems Review of Systems   Physical Exam Triage Vital Signs ED Triage Vitals  Enc Vitals Group     BP 11/24/20 1213 102/71     Pulse Rate 11/24/20 1213 79     Resp 11/24/20 1213 18     Temp 11/24/20 1213 98.8 F (37.1 C)     Temp Source 11/24/20 1213 Oral     SpO2 11/24/20 1213 97 %     Weight --      Height --  Head Circumference --      Peak Flow --      Pain Score 11/24/20 1216 8     Pain Loc --      Pain Edu? --      Excl. in GC? --    No data found.  Updated Vital Signs BP 102/71 (BP Location: Left Arm)   Pulse 79   Temp 98.8 F (37.1 C) (Oral)   Resp 18   SpO2 97%   Visual Acuity Right Eye Distance:   Left Eye Distance:   Bilateral Distance:    Right Eye Near:   Left Eye Near:    Bilateral Near:     Physical Exam Constitutional:      General: She is not in acute distress.    Appearance: She is well-developed.  HENT:     Nose:      Comments: Purulent drainage from upper lip just at base of nose; still with tender and indurated tissue to upper lip and base of nose but has decreased in size Cardiovascular:     Rate and Rhythm: Normal rate.  Pulmonary:     Effort: Pulmonary effort is normal.  Skin:    General: Skin is warm and dry.  Neurological:     Mental Status: She is alert and oriented to person, place, and time.      UC Treatments / Results   Labs (all labs ordered are listed, but only abnormal results are displayed) Labs Reviewed - No data to display  EKG   Radiology No results found.  Procedures Procedures (including critical care time)  Medications Ordered in UC Medications - No data to display  Initial Impression / Assessment and Plan / UC Course  I have reviewed the triage vital signs and the nursing notes.  Pertinent labs & imaging results that were available during my care of the patient were reviewed by me and considered in my medical decision making (see chart for details).     Draining abscess to upper lip. Appears improved and now draining. HR has improved and patient verbalizes subjective improvement. Continue with antibiotics. Return precautions provided. Patient verbalized understanding and agreeable to plan.   Final Clinical Impressions(s) / UC Diagnoses   Final diagnoses:  Facial abscess     Discharge Instructions     Complete course of antibiotics as prescribed.  Apply heat or warm compress to the area to promote additional drainage.  I would expect gradual continued improvement.  If this has not resolved by the end of medications or if it begins to worsen at all please return.   Kurangiza inzira ya antibiotike nkuko byateganijwe. Madison Hickman ubushyuhe cyangwa ubushyuhe bushyushye mukarere kugirango utezimbere amazi. Nagira ngo nkomeze gutera imbere buhoro buhoro. Niba ibi bitarakemuwe nimiti irangiye cyangwa niba itangiye gukomera na gato nyamuneka garuka.   ED Prescriptions    None     PDMP not reviewed this encounter.   Georgetta Haber, NP 11/24/20 1238

## 2020-11-24 NOTE — ED Triage Notes (Signed)
Pt reports she was seen on Friday for the samre and returns today because she still has nasal pain and sores inside her nose.

## 2020-11-24 NOTE — Discharge Instructions (Signed)
Complete course of antibiotics as prescribed.  Apply heat or warm compress to the area to promote additional drainage.  I would expect gradual continued improvement.  If this has not resolved by the end of medications or if it begins to worsen at all please return.   Kurangiza inzira ya antibiotike nkuko byateganijwe. Olivia Rocha ubushyuhe cyangwa ubushyuhe bushyushye mukarere kugirango utezimbere amazi. Nagira ngo nkomeze gutera imbere buhoro buhoro. Niba ibi bitarakemuwe nimiti irangiye cyangwa niba itangiye gukomera na gato nyamuneka garuka.

## 2020-11-27 ENCOUNTER — Other Ambulatory Visit: Payer: Self-pay | Admitting: Internal Medicine

## 2020-11-27 DIAGNOSIS — R599 Enlarged lymph nodes, unspecified: Secondary | ICD-10-CM

## 2020-11-27 NOTE — Progress Notes (Signed)
Will order CBC diff, CMP, HIV for initial screening of persistent inguinal LAP (per recent Doppler US), and patient will be seen in clinic for evaluation of LAP (to consider further testing, such as GU vs systemic infectious etiology, or abdominal/pelvic/chest imaging if suspicious for malignancy.)  Talked to the patient's son regarding coming to the clinic for lab only visit.   Chevis Pretty, MD [id number] 11/27/2020, 4:58 PM Pager: [phone]

## 2020-11-27 NOTE — Addendum Note (Signed)
Addended by: Neomia Dear on: 11/27/2020 03:48 PM   Modules accepted: Orders

## 2020-12-04 ENCOUNTER — Telehealth: Payer: Self-pay | Admitting: Internal Medicine

## 2020-12-04 NOTE — Telephone Encounter (Signed)
Please refer to message below.  Attempted to contact patient this morning, but message keeps coming on stating sorry my call cannot be completed at this time.

## 2020-12-04 NOTE — Telephone Encounter (Signed)
-----   Message from [facility], MD sent at 12/02/2020  3:13 PM EST ----- Regarding: RE: Lab only visit before in person appointment I dialed the number that ends with [phone] early in AM, and the son answered. ----- Message ----- From: Lianne Bushy Sent: 12/02/2020   3:04 PM EST To: Chevis Pretty, MD Subject: RE: Lab only visit before in person appointm#  Patient and son did not show up or call on Monday for labs.  Do you remember which phone number on file belongs to her son?  If you do please let me know.  That would help tremendously. I will give them a call.  Thanks,  [place] ----- Message ----- From: Chevis Pretty, MD Sent: 11/27/2020   4:15 PM EST To: Bufford Spikes, Imp [employer], # Subject: Lab only visit before in person appointment    Hi I am going to order some basic blood work to be ready for the next visit.  I called patient and talked to her son about that . He is not sure what time they can come on Monday but can decide on Monday. I appreciate to arrange it and let them know.   Front desk: Her in-person appointment is on 12/28, would you please make it sooner? May be end of next week? And it is going to be for swelling lymph node of groins.  Thanks

## 2020-12-16 ENCOUNTER — Encounter: Payer: BC Managed Care – PPO | Admitting: Internal Medicine

## 2021-01-26 ENCOUNTER — Encounter (HOSPITAL_COMMUNITY): Payer: Self-pay

## 2021-01-26 ENCOUNTER — Other Ambulatory Visit (HOSPITAL_COMMUNITY): Payer: Self-pay | Admitting: Family Medicine

## 2021-01-26 ENCOUNTER — Ambulatory Visit (HOSPITAL_COMMUNITY)
Admission: EM | Admit: 2021-01-26 | Discharge: 2021-01-26 | Disposition: A | Discharge status: Home / Self Care | Payer: BC Managed Care – PPO | Attending: Family Medicine | Admitting: Family Medicine

## 2021-01-26 DIAGNOSIS — Z7901 Long term (current) use of anticoagulants: Secondary | ICD-10-CM | POA: Insufficient documentation

## 2021-01-26 DIAGNOSIS — R519 Headache, unspecified: Secondary | ICD-10-CM | POA: Diagnosis present

## 2021-01-26 DIAGNOSIS — M25512 Pain in left shoulder: Secondary | ICD-10-CM | POA: Diagnosis not present

## 2021-01-26 DIAGNOSIS — Z20822 Contact with and (suspected) exposure to covid-19: Secondary | ICD-10-CM | POA: Diagnosis not present

## 2021-01-26 DIAGNOSIS — M79605 Pain in left leg: Secondary | ICD-10-CM

## 2021-01-26 DIAGNOSIS — J069 Acute upper respiratory infection, unspecified: Secondary | ICD-10-CM

## 2021-01-26 DIAGNOSIS — M25511 Pain in right shoulder: Secondary | ICD-10-CM

## 2021-01-26 DIAGNOSIS — R051 Acute cough: Secondary | ICD-10-CM | POA: Insufficient documentation

## 2021-01-26 MED ORDER — NAPROXEN 500 MG PO TABS: 500.0000 mg | ORAL_TABLET | Freq: Two times a day (BID) | ORAL | 0 refills | Status: DC | PRN

## 2021-01-26 MED ORDER — DICLOFENAC SODIUM 1 % EX GEL: 2.0000 g | Freq: Four times a day (QID) | CUTANEOUS | 0 refills | Status: DC | PRN

## 2021-01-26 MED FILL — DICLOFENAC SODIUM 1 % GEL: 1 | 13 days supply | Qty: 100 | Fill #0

## 2021-01-26 MED FILL — NAPROXEN 500 MG TABLET: 500 | 15 days supply | Qty: 30 | Fill #0

## 2021-01-26 NOTE — ED Triage Notes (Signed)
Pt presents with a headache, cough and right shoulder pain X 3 days. Pt states she has not taken OTC pain medication.

## 2021-01-26 NOTE — ED Provider Notes (Signed)
[facility]    CSN: [id number] Arrival date & time: 01/26/21  1123      History   Chief Complaint Chief Complaint  Patient presents with  . Cough  . Shoulder Pain    HPI Olivia Rocha is a 55 y.o. female.   Medical translator used today to facilitate visit with patient's consent. Here today with headache, cough, runny nose, and b/l shoulder soreness x 3 days. Thinks the shoulder pain is from work and notes this has happened before. Tried an OTC cream but this caused itching so she stopped. Denies numbness, tingling, swelling, known injury. Not trying anything OTC for the sick sxs, denies sick contacts, known hx of pulmonary dz.      History reviewed. No pertinent past medical history.  Patient Active Problem List   Diagnosis Date Noted  . Left groin pain 11/07/2020  . Language barrier affecting health care 03/18/2020  . Cellulitis of left lower extremity 01/31/2020  . 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 DVT (deep venous thrombosis) (HCC) 01/21/2020  . Varicose veins of left leg with edema 11/06/2015  . Tinea pedis of left foot 11/06/2015  . Low TSH level 09/22/2015  . Prediabetes 09/19/2015  . Cervical high risk HPV (human papillomavirus) test positive 09/11/2015    Past Surgical History:  Procedure Laterality Date  . CESAREAN SECTION    . IR TRANSCATH PLC STENT 1ST ART NOT LE CV CAR VERT CAR  01/23/2020  . IR US GUIDE VASC ACCESS LEFT  01/23/2020  . IR VENO/EXT/UNI LEFT  01/23/2020    OB History    Gravida  13   Para  12   Term  12   Preterm  0   AB  1   Living        SAB  1   IAB  0   Ectopic  0   Multiple      Live Births               Home Medications    Prior to Admission medications   Medication Sig Start Date End Date Taking? Authorizing Provider  diclofenac Sodium (VOLTAREN) 1 % GEL Apply 2 g topically 4 (four) times daily as needed. 01/26/21  Yes Particia Nearing, PA-C   doxycycline (VIBRAMYCIN) 100 MG capsule Take 1 capsule (100 mg total) by mouth 2 (two) times daily. 11/21/20   Georgetta Haber, NP  naproxen (NAPROSYN) 500 MG tablet Take 1 tablet (500 mg total) by mouth 2 (two) times daily as needed. 01/26/21   Particia Nearing, PA-C  rivaroxaban (XARELTO) 20 MG TABS tablet TAKE 1 TABLET (20 MG TOTAL) BY MOUTH DAILY WITH SUPPER. 09/29/20   [place], Karma Ganja D, DO    Family History History reviewed. No pertinent family history.  Social History Social History   Tobacco Use  . Smoking status: Never Smoker  . Smokeless tobacco: Never Used  Substance Use Topics  . Alcohol use: Yes    Comment: "sometimes"  . Drug use: Never     Allergies   Patient has no known allergies.   Review of Systems Review of Systems PER HPI    Physical Exam Triage Vital Signs ED Triage Vitals  Enc Vitals Group     BP 01/26/21 1343 115/72     Pulse Rate 01/26/21 1343 80     Resp 01/26/21 1343 16     Temp 01/26/21 1343 97.9 F (36.6 C)  Temp Source 01/26/21 1343 Temporal     SpO2 01/26/21 1343 96 %     Weight --      Height --      Head Circumference --      Peak Flow --      Pain Score 01/26/21 1342 7     Pain Loc --      Pain Edu? --      Excl. in GC? --    No data found.  Updated Vital Signs BP 115/72 (BP Location: Right Arm)   Pulse 80   Temp 97.9 F (36.6 C) (Temporal)   Resp 16   SpO2 96%   Visual Acuity Right Eye Distance:   Left Eye Distance:   Bilateral Distance:    Right Eye Near:   Left Eye Near:    Bilateral Near:     Physical Exam Vitals and nursing note reviewed.  Constitutional:      Appearance: Normal appearance. She is not ill-appearing.  HENT:     Head: Atraumatic.     Nose: Rhinorrhea present.     Mouth/Throat:     Mouth: Mucous membranes are moist.     Pharynx: Posterior oropharyngeal erythema present.  Eyes:     Extraocular Movements: Extraocular movements intact.     Conjunctiva/sclera: Conjunctivae  normal.  Cardiovascular:     Rate and Rhythm: Normal rate and regular rhythm.     Heart sounds: Normal heart sounds.  Pulmonary:     Effort: Pulmonary effort is normal. No respiratory distress.     Breath sounds: Normal breath sounds. No wheezing or rales.  Musculoskeletal:        General: Tenderness (diffuse b/l shoulder soreness) present. No swelling, deformity or signs of injury. Normal range of motion.     Cervical back: Normal range of motion and neck supple.  Skin:    General: Skin is warm and dry.     Findings: No bruising or erythema.  Neurological:     Mental Status: She is alert and oriented to person, place, and time.  Psychiatric:        Mood and Affect: Mood normal.        Thought Content: Thought content normal.        Judgment: Judgment normal.      UC Treatments / Results  Labs (all labs ordered are listed, but only abnormal results are displayed) Labs Reviewed  SARS CORONAVIRUS 2 (TAT 6-24 HRS)    EKG   Radiology No results found.  Procedures Procedures (including critical care time)  Medications Ordered in UC Medications - No data to display  Initial Impression / Assessment and Plan / UC Course  I have reviewed the triage vital signs and the nursing notes.  Pertinent labs & imaging results that were available during my care of the patient were reviewed by me and considered in my medical decision making (see chart for details).     COVID pcr pending, discussed OTC remedies for sxs, isolation. Suspect muscular soreness for shoulder pain - treat with prn naproxen, diclofenac gel. Strict return precautions given for acutely worsening sxs.   Final Clinical Impressions(s) / UC Diagnoses   Final diagnoses:  Viral URI with cough  Acute pain of both shoulders   Discharge Instructions   None    ED Prescriptions    Medication Sig Dispense Auth. Provider   naproxen (NAPROSYN) 500 MG tablet Take 1 tablet (500 mg total) by mouth 2 (two) times daily  as  needed. 30 tablet Particia Nearing, [place]   diclofenac Sodium (VOLTAREN) 1 % GEL Apply 2 g topically 4 (four) times daily as needed. 150 g Particia Nearing, [place]     PDMP not reviewed this encounter.   Particia Nearing, [place] 01/26/21 2013

## 2021-01-27 LAB — SARS CORONAVIRUS 2 (TAT 6-24 HRS): SARS Coronavirus 2: NEGATIVE

## 2021-01-30 ENCOUNTER — Telehealth: Payer: Self-pay

## 2021-01-30 NOTE — Telephone Encounter (Signed)
Contacted patient but spoke with husband. Patient was asleep after a night shift at the [employer].I informed patient husband that I would like to assist patient with Dr`s appointments. He was appreciative and informed me that communication has been a problem due to language barrier. He informed me that Monday mornings are good for appointments. Massage relayed to Dr`s office.  Arman Bogus RN BSn PCCN  [facility] [phone]-cell [phone]-office

## 2021-02-02 ENCOUNTER — Telehealth: Payer: Self-pay

## 2021-02-02 NOTE — Telephone Encounter (Signed)
Contacted patient and informed that appointment has been scheduled for 02/09/21 @ 0945.  Arman Bogus RN BSn PCCN  [facility] [phone]-cell [phone]-office

## 2021-02-09 ENCOUNTER — Encounter: Payer: Self-pay | Admitting: Internal Medicine

## 2021-02-09 ENCOUNTER — Ambulatory Visit: Payer: BC Managed Care – PPO | Admitting: Internal Medicine

## 2021-02-09 ENCOUNTER — Other Ambulatory Visit: Payer: Self-pay

## 2021-02-09 VITALS — BP 101/72 | HR 74 | Temp 97.7°F | Ht <= 58 in | Wt 123.1 lb

## 2021-02-09 DIAGNOSIS — I83892 Varicose veins of left lower extremities with other complications: Secondary | ICD-10-CM

## 2021-02-09 DIAGNOSIS — R599 Enlarged lymph nodes, unspecified: Secondary | ICD-10-CM | POA: Diagnosis not present

## 2021-02-09 DIAGNOSIS — R7303 Prediabetes: Secondary | ICD-10-CM

## 2021-02-09 DIAGNOSIS — R1032 Left lower quadrant pain: Secondary | ICD-10-CM

## 2021-02-09 DIAGNOSIS — I871 Compression of vein: Secondary | ICD-10-CM

## 2021-02-09 LAB — POCT GLYCOSYLATED HEMOGLOBIN (HGB A1C): Hemoglobin A1C: 6.4 % — AB (ref 4.0–5.6)

## 2021-02-09 LAB — GLUCOSE, CAPILLARY: Glucose-Capillary: 105 mg/dL — ABNORMAL HIGH (ref 70–99)

## 2021-02-09 NOTE — Progress Notes (Signed)
Acute Office Visit  Subjective:    Patient ID: Olivia Rocha, female    DOB: January 03, 1966, 55 y.o.   MRN: [id number]  CC: F/u of lymphadenopathy and groin pain  HPI Patient is in today for f/u of LAP and groin pain. Translator is in the room and assisted with taking Hx. Please refer to problem based charting for further details and assessment and plan.  Past Surgical History:  Procedure Laterality Date  . CESAREAN SECTION    . IR TRANSCATH PLC STENT 1ST ART NOT LE CV CAR VERT CAR  01/23/2020  . IR US GUIDE VASC ACCESS LEFT  01/23/2020  . IR VENO/EXT/UNI LEFT  01/23/2020    No family history on file.  Social History   Socioeconomic History  . Marital status: Married    Spouse name: Not on file  . Number of children: Not on file  . Years of education: Not on file  . Highest education level: Not on file  Occupational History  . Not on file  Tobacco Use  . Smoking status: Never Smoker  . Smokeless tobacco: Never Used  Substance and Sexual Activity  . Alcohol use: Yes    Comment: "sometimes"  . Drug use: Never  . Sexual activity: Yes    Birth control/protection: None  Other Topics Concern  . Not on file  Social History Narrative   ** Merged History Encounter **       Social Determinants of Health   Financial Resource Strain: Not on file  Food Insecurity: Not on file  Transportation Needs: Not on file  Physical Activity: Not on file  Stress: Not on file  Social Connections: Not on file  Intimate Partner Violence: Not on file    Outpatient Medications Prior to Visit  Medication Sig Dispense Refill  . diclofenac Sodium (VOLTAREN) 1 % GEL Apply 2 g topically 4 (four) times daily as needed. 150 g 0  . doxycycline (VIBRAMYCIN) 100 MG capsule Take 1 capsule (100 mg total) by mouth 2 (two) times daily. 20 capsule 0  . naproxen (NAPROSYN) 500 MG tablet Take 1 tablet (500 mg total) by mouth 2 (two) times daily as needed. 30 tablet 0  . rivaroxaban (XARELTO) 20 MG  TABS tablet TAKE 1 TABLET (20 MG TOTAL) BY MOUTH DAILY WITH SUPPER. 30 tablet 2   No facility-administered medications prior to visit.    No Known Allergies  Review of Systems  Constitutional: Negative for chills and fever.  Genitourinary: Negative for dysuria, genital sores and vaginal discharge.     Objective:   BP 101/72 (BP Location: Left Arm, Patient Position: Sitting, Cuff Size: Small)   Pulse 74   Temp 97.7 F (36.5 C) (Oral)   Ht 4\' 7"  (1.397 m)   Wt 123 lb 1.6 oz (55.8 kg)   SpO2 98%   BMI 28.61 kg/m  Wt Readings from Last 3 Encounters:  02/09/21 123 lb 1.6 oz (55.8 kg)  11/07/20 127 lb 8 oz (57.8 kg)  05/26/20 128 lb 12.8 oz (58.4 kg)    Physical Exam Constitutional:      General: She is not in acute distress.    Appearance: Normal appearance. She is not toxic-appearing.  Cardiovascular:     Rate and Rhythm: Normal rate and regular rhythm.     Pulses: Normal pulses.     Heart sounds: Normal heart sounds. No murmur heard. Abdominal:     Palpations: Abdomen is soft.     Tenderness: There is no abdominal  tenderness.  Musculoskeletal:        General: No swelling or tenderness.     Comments: Trace swelling and trace questionable tenderness of left leg  Lymphadenopathy:     Lower Body: No right inguinal adenopathy. No left inguinal adenopathy.  Neurological:     Mental Status: She is alert.    Health Maintenance Due  Topic Date Due  . Hepatitis C Screening  Never done  . COVID-19 Vaccine (1) Never done  . COLONOSCOPY (Pts 45-64yrs Insurance coverage will need to be confirmed)  Never done  . PAP SMEAR-Modifier  09/10/2016  . MAMMOGRAM  Never done  . INFLUENZA VACCINE  07/20/2020    There are no preventive care reminders to display for this patient.   Lab Results  Component Value Date   TSH 0.761 05/16/2018   Lab Results  Component Value Date   WBC 7.1 02/09/2021   HGB 14.6 02/09/2021   HCT 44.3 02/09/2021   MCV 90 02/09/2021   PLT 282  02/09/2021   Lab Results  Component Value Date   NA 143 01/24/2020   K 3.7 01/24/2020   CO2 26 01/24/2020   GLUCOSE 108 (H) 01/24/2020   BUN 5 (L) 01/24/2020   CREATININE 0.54 01/24/2020   BILITOT 1.0 01/21/2020   ALKPHOS 71 01/21/2020   AST 30 01/21/2020   ALT 29 01/21/2020   PROT 6.7 01/21/2020   ALBUMIN 3.1 (L) 01/21/2020   CALCIUM 8.8 (L) 01/24/2020   ANIONGAP 12 01/24/2020   Lab Results  Component Value Date   CHOL 192 05/16/2018   Lab Results  Component Value Date   HDL 64 05/16/2018   Lab Results  Component Value Date   LDLCALC 97 05/16/2018   Lab Results  Component Value Date   TRIG 157 (H) 05/16/2018   Lab Results  Component Value Date   CHOLHDL 3.0 05/16/2018   Lab Results  Component Value Date   HGBA1C 6.4 (A) 02/09/2021       Assessment & Plan:   Problem List Items Addressed This Visit      Cardiovascular and Mediastinum   Stenosis of left iliac vein    No more anti plt needed at this point. (Confirmed by IR through a secure chat)      Varicose veins of left leg with edema    Lymphadenopathy: Patient had bilateral LAP reported on prior US.  Today, no LAP on exam and no  More groin tenderness. She denies vaginal discharge.   Will repeat US today to reassess LAP  -Soft tissue US of groin, bilateral (talked to radiology for appropriate order)         Immune and Lymphatic   Enlarged lymph node - Primary   Relevant Orders   [place] RT LOWER EXTREM LTD SOFT TISSUE NON VASCULAR   US LT LOWER EXTREM LTD SOFT TISSUE NON VASCULAR     Other   Left groin pain    Resolved.      Prediabetes    Checking HbA1c today.  ADDENDUM: HbA1c>6.4. Attempted to call Nicole Cella Muhoro RN BSN PCCN (patient's contact and translator) to discuss starting Metformin with patient. Pt has Hx of medication non adherence and would prefer life style modification. Nicole Cella will discuss this with patient and will let me know if she agrees with starting Metformin. -Low  carb diet -Repeat HbA1c in 4-6 months       Relevant Orders   POC Hbg A1C (Completed)      No orders  of the defined types were placed in this encounter.    Chevis Pretty, MD

## 2021-02-09 NOTE — Patient Instructions (Addendum)
Thank you for allowing [place] to provide your care today.   1- I order an ultrasound of your groin for follow up of your prior enlarged node. Someone from radiology will call you for the appointment.  2- You do not need to restart Xarelto. I will contact the physician who placed the stent for you, for more recommendation and if you should restart aspirin. 3-I send you to the lab for a screening test for diabetes. Please come back to clinic in 1 month to follow up with PCP.   Should you have any questions or concerns please call the internal medicine clinic at [phone].    Thank you!

## 2021-02-09 NOTE — Addendum Note (Signed)
Addended byChevis Pretty on: 02/09/2021 11:14 AM   Modules accepted: Orders

## 2021-02-09 NOTE — Congregational Nurse Program (Signed)
  Dept: [phone]   Congregational Nurse Program Note  Date of Encounter: 02/09/2021  Past Medical History: DVT left femoral 02/09/20 Pre diabetes Left Shoulder pain  Encounter Details:  I accompanied patient for today's appointment and assisted with translation.Referrals made for right and left lower extremities  u/sound.  Doctor will call if need to take blood thinner. Patient informed  in swahili and verbalized understanding.Assisted patient to go to the lab for lab draws and will arrange for her ride home.  Arman Bogus RN BSn PCCN  [facility] [phone]-cell [phone]-office

## 2021-02-10 ENCOUNTER — Telehealth: Payer: Self-pay

## 2021-02-10 LAB — CBC WITH DIFFERENTIAL/PLATELET
Basophils Absolute: 0.1 10*3/uL (ref 0.0–0.2)
Basos: 1 %
EOS (ABSOLUTE): 0.7 10*3/uL — ABNORMAL HIGH (ref 0.0–0.4)
Eos: 10 %
Hematocrit: 44.3 % (ref 34.0–46.6)
Hemoglobin: 14.6 g/dL (ref 11.1–15.9)
Immature Grans (Abs): 0 10*3/uL (ref 0.0–0.1)
Immature Granulocytes: 0 %
Lymphocytes Absolute: 1.4 10*3/uL (ref 0.7–3.1)
Lymphs: 19 %
MCH: 29.8 pg (ref 26.6–33.0)
MCHC: 33 g/dL (ref 31.5–35.7)
MCV: 90 fL (ref 79–97)
Monocytes Absolute: 0.5 10*3/uL (ref 0.1–0.9)
Monocytes: 7 %
Neutrophils Absolute: 4.4 10*3/uL (ref 1.4–7.0)
Neutrophils: 63 %
Platelets: 282 10*3/uL (ref 150–450)
RBC: 4.9 x10E6/uL (ref 3.77–5.28)
RDW: 12.3 % (ref 11.7–15.4)
WBC: 7.1 10*3/uL (ref 3.4–10.8)

## 2021-02-10 LAB — HIV ANTIBODY (ROUTINE TESTING W REFLEX): HIV Screen 4th Generation wRfx: NONREACTIVE

## 2021-02-10 NOTE — Telephone Encounter (Signed)
Contacted patient and informed her of  Ultrasound appointment scheduled for March 18th at 1:30 pm @ [place] U/sound dept.  Arman Bogus RN BSn PCCN  [facility] [phone]-cell [phone]-office

## 2021-02-12 DIAGNOSIS — R599 Enlarged lymph nodes, unspecified: Secondary | ICD-10-CM | POA: Insufficient documentation

## 2021-02-12 NOTE — Assessment & Plan Note (Signed)
Has not been compliant to Xarelto. Out of 6 months window period from provoked DVt so will not resume anticogulation.

## 2021-02-12 NOTE — Assessment & Plan Note (Signed)
Resolved

## 2021-02-12 NOTE — Progress Notes (Signed)
Internal Medicine Clinic Attending  Case discussed with Dr. Masoudi  At the time of the visit.  We reviewed the resident's history and exam and pertinent patient test results.  I agree with the assessment, diagnosis, and plan of care documented in the resident's note.  

## 2021-02-12 NOTE — Assessment & Plan Note (Signed)
No more anti plt needed at this point. (Confirmed by IR through a secure chat)

## 2021-02-12 NOTE — Assessment & Plan Note (Signed)
Checking HbA1c today.  ADDENDUM: HbA1c>6.4. Attempted to call Nicole Cella Muhoro RN BSN PCCN (patient's contact and translator) to discuss starting Metformin with patient. Pt has Hx of medication non adherence and would prefer life style modification. Nicole Cella will discuss this with patient and will let me know if she agrees with starting Metformin. -Low carb diet -Repeat HbA1c in 4-6 months

## 2021-02-12 NOTE — Assessment & Plan Note (Signed)
Lymphadenopathy: Patient had bilateral LAP reported on prior US.  Today, no LAP on exam and no  More groin tenderness. She denies vaginal discharge.   Will repeat US today to reassess LAP  -Soft tissue US of groin, bilateral (talked to radiology for appropriate order)

## 2021-03-04 NOTE — Congregational Nurse Program (Signed)
°  Dept: [phone]   Congregational Nurse Program Note  Date of Encounter: 03/04/2021  Past Medical History: No past medical history on file.  Encounter Details:  CNP Questionnaire - 03/04/21 1010      Questionnaire   Do you give verbal consent to treat you today? Yes    Visit Setting Home    Location Patient Served At [facility]    Patient Status Refugee    Medical Provider Yes    Insurance Private Insurance    Intervention Advocate;Assess (including screenings);Counsel;Educate;Refer;Support    Transportation Need transportation assistance    Medication Have medication insecurities    Referrals PCP - other provider;Other         Previously discussed Hemoglobin A1C 6.4 with PCP and need for patient education. Home visit completed. Patient educated on diabetes disease,diabetic diet and lifestyle modifications. Patient would like to make lifestyle changes in diet and add routine exercise. Patient does not smoke or consume alcohol.I will print out some educational materials in [place] for more education. I have reminded the patient of upcoming appointment for U/sound.  Arman Bogus RN BSn PCCN  [facility] [phone]-cell [phone]-office

## 2021-03-06 ENCOUNTER — Other Ambulatory Visit: Payer: Self-pay

## 2021-03-06 ENCOUNTER — Ambulatory Visit (HOSPITAL_COMMUNITY)
Admission: RE | Admit: 2021-03-06 | Discharge: 2021-03-06 | Disposition: A | Discharge status: Home / Self Care | Payer: BC Managed Care – PPO | Source: Ambulatory Visit | Attending: Internal Medicine | Admitting: Internal Medicine

## 2021-03-06 DIAGNOSIS — R599 Enlarged lymph nodes, unspecified: Secondary | ICD-10-CM | POA: Insufficient documentation

## 2021-03-08 ENCOUNTER — Telehealth: Payer: Self-pay

## 2021-03-08 NOTE — Telephone Encounter (Signed)
Called patient to remind her of appointment tomorrow. Patient husband answered the phone. He will let the patient know.   Arman Bogus RN BSn PCCN  [facility] [phone]-cell [phone]-office

## 2021-03-09 ENCOUNTER — Encounter: Payer: Self-pay | Admitting: Internal Medicine

## 2021-03-09 ENCOUNTER — Ambulatory Visit (INDEPENDENT_AMBULATORY_CARE_PROVIDER_SITE_OTHER): Payer: BC Managed Care – PPO | Admitting: Internal Medicine

## 2021-03-09 ENCOUNTER — Other Ambulatory Visit: Payer: Self-pay | Admitting: Internal Medicine

## 2021-03-09 ENCOUNTER — Other Ambulatory Visit: Payer: Self-pay

## 2021-03-09 VITALS — BP 117/74 | HR 73 | Temp 97.9°F | Ht <= 58 in | Wt 122.5 lb

## 2021-03-09 DIAGNOSIS — R599 Enlarged lymph nodes, unspecified: Secondary | ICD-10-CM | POA: Diagnosis not present

## 2021-03-09 DIAGNOSIS — R7303 Prediabetes: Secondary | ICD-10-CM | POA: Diagnosis not present

## 2021-03-09 DIAGNOSIS — Z1211 Encounter for screening for malignant neoplasm of colon: Secondary | ICD-10-CM

## 2021-03-09 DIAGNOSIS — Z0000 Encounter for general adult medical examination without abnormal findings: Secondary | ICD-10-CM | POA: Diagnosis not present

## 2021-03-09 MED ORDER — DICLOFENAC SODIUM 1 % EX GEL: 2.0000 g | Freq: Four times a day (QID) | CUTANEOUS | 2 refills | Status: DC | PRN

## 2021-03-09 MED FILL — DICLOFENAC SODIUM 1 % GEL: 1 | 25 days supply | Qty: 200 | Fill #0

## 2021-03-09 NOTE — Assessment & Plan Note (Addendum)
I reviewed her prior A1C with her and discussed lifestyle modifications. Her diet is mainly comprised of foods from her culture and, unfortunately, I was unable to illicit more details from her in regards to what kind of foods these are. I encouraged her to increase her physical activity which she was agreeable with. I also offered a referral to RD however she declines at this time.

## 2021-03-09 NOTE — Progress Notes (Signed)
Call to patient's insurance company.  Cologuard is covered 100% every 3 years.  Patient is able to get her first 1 at no charge.  Angelina Ok, RN 03/09/2021 11:15 AM.

## 2021-03-09 NOTE — Progress Notes (Signed)
Office Visit   Patient ID: Olivia Rocha, female    DOB: January 28, 1966, 55 y.o.   MRN: [id number]  Subjective:  CC: inguinal lymph node enlargement, prediabetes  55 y.o. presents today for review of lymph node ultrasounds that were ordered for follow up of lymph nodes noted on prior imaging. An interpret was used for the entirety of the visit. Please refer to problem based charting for assessment and plan.  ACTIVE MEDICATIONS   Outpatient Medications Prior to Visit  Medication Sig Dispense Refill  . naproxen (NAPROSYN) 500 MG tablet Take 1 tablet (500 mg total) by mouth 2 (two) times daily as needed. 30 tablet 0  . diclofenac Sodium (VOLTAREN) 1 % GEL Apply 2 g topically 4 (four) times daily as needed. 150 g 0  . doxycycline (VIBRAMYCIN) 100 MG capsule Take 1 capsule (100 mg total) by mouth 2 (two) times daily. 20 capsule 0   No facility-administered medications prior to visit.     Objective:   BP 117/74 (BP Location: Left Arm, Patient Position: Sitting, Cuff Size: Normal)   Pulse 73   Temp 97.9 F (36.6 C) (Oral)   Ht 4\' 10"  (1.473 m)   Wt 122 lb 8 oz (55.6 kg)   SpO2 97% Comment: room air  BMI 25.60 kg/m  Wt Readings from Last 3 Encounters:  03/09/21 122 lb 8 oz (55.6 kg)  02/09/21 123 lb 1.6 oz (55.8 kg)  11/07/20 127 lb 8 oz (57.8 kg)   BP Readings from Last 3 Encounters:  03/09/21 117/74  02/09/21 101/72  01/26/21 115/72   Physical exam General: well appearing in NAD Cardiac: regular rate, extremities warm Pulm: breathing comfortably on room air Skin: no rash on limited exam  Health Maintenance:   Health Maintenance  Topic Date Due  . Hepatitis C Screening  Never done  . COVID-19 Vaccine (1) Never done  . COLONOSCOPY (Pts 45-15yrs Insurance coverage will need to be confirmed)  Never done  . PAP SMEAR-Modifier  09/10/2016  . INFLUENZA VACCINE  04/25/2021 (Originally 07/20/2020)  . MAMMOGRAM  03/09/2022 (Originally 12/19/2016)  . TETANUS/TDAP   09/26/2028  . HIV Screening  Completed  . HPV VACCINES  Aged Out     Assessment & Plan:   Problem List Items Addressed This Visit      Immune and Lymphatic   RESOLVED: Enlarged lymph node    Pt presents for follow up of ultrasound results for inguinal lymphadenopathy that was initially noted on imaging. She completed ultrasound of these on 3/19. Report indicates that these have features that are consistent with a benign process. Results reviewed with pt. -no further workup or monitoring indicated at this time.        Other   Prediabetes (Chronic)    I reviewed her prior A1C with her and discussed lifestyle modifications. Her diet is mainly comprised of foods from her culture and, unfortunately, I was unable to illicit more details from her in regards to what kind of foods these are. I encouraged her to increase her physical activity which she was agreeable with. I also offered a referral to RD however she declines at this time.      Healthcare maintenance (Chronic)    Discussed recommendations of colorectal cancer screening. Pt declines colonoscopy and would prefer to use cologuard. She expressed understanding that a positive test would indicate a colonoscopy for diagnostic purposes. 4/19 has confirmed coverage by [employer]. -cologuard ordered at today's visit  She is due for breast cancer  screening as well however she does not feel that she needs one as her breasts to not hurt. Discussed the purpose and importance of screening for early detection however she continues to decline at today's visit.  Discussed cervical cancer screening. Last pap smear was in 2016 which was NILM however she was HPV +. She was instructed to follow up in one year for repeat cotesting however it does not appear that this ever happened.  Despite discussing this, she declined pap smear at today's visit.  -recommend strongly encouraging cervical cancer screening at future visits       Other Visit  Diagnoses    Colon cancer screening    -  Primary   Relevant Orders   Cologuard       Return for f/u with Dr. Barbaraann Faster PCP next clinic month.   Pt discussed with Dr. Antony Contras.  Elige Radon, MD Internal Medicine Resident PGY-2 [facility] Pager: [phone] 03/09/2021 2:44 PM

## 2021-03-09 NOTE — Assessment & Plan Note (Addendum)
Discussed recommendations of colorectal cancer screening. Pt declines colonoscopy and would prefer to use cologuard. She expressed understanding that a positive test would indicate a colonoscopy for diagnostic purposes. Olivia Rocha has confirmed coverage by [employer]. -cologuard ordered at today's visit  She is due for breast cancer screening as well however she does not feel that she needs one as her breasts to not hurt. Discussed the purpose and importance of screening for early detection however she continues to decline at today's visit.  Discussed cervical cancer screening. Last pap smear was in 2016 which was NILM however she was HPV +. She was instructed to follow up in one year for repeat cotesting however it does not appear that this ever happened.  Despite discussing this, she declined pap smear at today's visit.  -recommend strongly encouraging cervical cancer screening at future visits

## 2021-03-09 NOTE — Assessment & Plan Note (Signed)
Pt presents for follow up of ultrasound results for inguinal lymphadenopathy that was initially noted on imaging. She completed ultrasound of these on 3/19. Report indicates that these have features that are consistent with a benign process. Results reviewed with pt. -no further workup or monitoring indicated at this time.

## 2021-04-06 ENCOUNTER — Encounter: Payer: BC Managed Care – PPO | Admitting: Internal Medicine

## 2022-04-01 LAB — COLOGUARD
